# Patient Record
Sex: Female | Born: 1960 | Race: White | Hispanic: No | Smoking: Never smoker
Health system: Southern US, Community
[De-identification: ages and names within clinical notes are randomized; demographics above are authoritative.]

## PROBLEM LIST (undated history)

## (undated) DIAGNOSIS — E669 Obesity, unspecified: Secondary | ICD-10-CM

## (undated) HISTORY — PX: TUBAL LIGATION: SHX77

## (undated) HISTORY — PX: FRACTURE SURGERY: SHX138

## (undated) HISTORY — DX: Obesity, unspecified: E66.9

---

## 1990-05-15 HISTORY — PX: ABDOMINAL HYSTERECTOMY: SHX81

## 2005-05-15 HISTORY — PX: ANKLE ARTHROSCOPY WITH FUSION: SHX5581

## 2006-05-15 HISTORY — PX: CHOLECYSTECTOMY: SHX55

## 2018-12-23 ENCOUNTER — Other Ambulatory Visit: Payer: Self-pay

## 2018-12-23 ENCOUNTER — Encounter: Payer: Self-pay | Admitting: Certified Nurse Midwife

## 2018-12-23 ENCOUNTER — Ambulatory Visit (INDEPENDENT_AMBULATORY_CARE_PROVIDER_SITE_OTHER): Payer: Managed Care, Other (non HMO) | Admitting: Certified Nurse Midwife

## 2018-12-23 VITALS — BP 130/88 | HR 83 | Ht 67.5 in | Wt 304.4 lb

## 2018-12-23 DIAGNOSIS — Z1231 Encounter for screening mammogram for malignant neoplasm of breast: Secondary | ICD-10-CM | POA: Diagnosis not present

## 2018-12-23 DIAGNOSIS — Z01419 Encounter for gynecological examination (general) (routine) without abnormal findings: Secondary | ICD-10-CM | POA: Diagnosis not present

## 2018-12-23 DIAGNOSIS — D229 Melanocytic nevi, unspecified: Secondary | ICD-10-CM

## 2018-12-23 DIAGNOSIS — Z124 Encounter for screening for malignant neoplasm of cervix: Secondary | ICD-10-CM | POA: Diagnosis not present

## 2018-12-23 DIAGNOSIS — Z1211 Encounter for screening for malignant neoplasm of colon: Secondary | ICD-10-CM

## 2018-12-23 NOTE — Progress Notes (Signed)
ANNUAL PREVENTATIVE CARE GYN  ENCOUNTER NOTE  Subjective:       Bethany Nash is a 58 y.o. [phone] female here for a routine annual gynecologic exam and lab work.   Works for [employer]. Family history of cancer in mother and brother.   Denies difficulty breathing or respiratory distress, chest pain, abdominal pain, vaginal bleeding, dysuria, and leg pain or swelling.    Gynecologic History  No LMP recorded. Patient has had a hysterectomy.  Contraception: status post hysterectomy  Last Pap: 12/2016. Results were: normal  Last mammogram: 07/2016. Results were: normal  Last colonoscopy: 2000. Results were normal  Obstetric History  OB History  Gravida Para Term Preterm AB Living  3 3 2 1  0 3  SAB TAB Ectopic Multiple Live Births  0 0 0 0 3    # Outcome Date GA Lbr Len/2nd Weight Sex Delivery Anes PTL Lv  3 Term 03/04/88   7 lb 15 oz (3.6 kg) F CS-LTranv Gen N LIV  2 Term 12/24/85   8 lb 11.3 oz (3.95 kg) M CS-LTranv Gen N LIV  1 Preterm 07/30/84   6 lb 6.3 oz (2.9 kg) F CS-LTranv Gen N LIV     Complications: Abruptio Placenta    Past Medical History:  Diagnosis Date  . Obesity     Past Surgical History:  Procedure Laterality Date  . ABDOMINAL HYSTERECTOMY  1992   still has ovaries  . ANKLE ARTHROSCOPY WITH FUSION  2007  . CESAREAN SECTION    . CHOLECYSTECTOMY  2008    Current Outpatient Medications on File Prior to Visit  Medication Sig Dispense Refill  . Multiple Vitamins-Minerals (BODY/HAIR/SKIN/NAILS PO) Take 1 tablet by mouth daily.    . Multiple Vitamins-Minerals (MULTIVITAMIN ADULT PO) Take 1 tablet by mouth daily.     No current facility-administered medications on file prior to visit.     No Known Allergies  Social History   Socioeconomic History  . Marital status: Divorced    Spouse name: Not on file  . Number of children: Not on file  . Years of education: Not on file  . Highest education level: Not on file  Occupational History  . Not on file   Social Needs  . Financial resource strain: Not on file  . Food insecurity    Worry: Not on file    Inability: Not on file  . Transportation needs    Medical: Not on file    Non-medical: Not on file  Tobacco Use  . Smoking status: Never Smoker  . Smokeless tobacco: Never Used  Substance and Sexual Activity  . Alcohol use: Yes    Alcohol/week: 3.0 standard drinks    Types: 3 Shots of liquor per week  . Drug use: Never  . Sexual activity: Not Currently    Birth control/protection: Surgical  Lifestyle  . Physical activity    Days per week: Not on file    Minutes per session: Not on file  . Stress: Not on file  Relationships  . Social Herbalist on phone: Not on file    Gets together: Not on file    Attends religious service: Not on file    Active member of club or organization: Not on file    Attends meetings of clubs or organizations: Not on file    Relationship status: Not on file  . Intimate partner violence    Fear of current or ex partner: Not on file  Emotionally abused: Not on file    Physically abused: Not on file    Forced sexual activity: Not on file  Other Topics Concern  . Not on file  Social History Narrative  . Not on file    Family History  Problem Relation Age of Onset  . Cancer Mother 80       unsure where it started  . Heart disease Father   . Alcoholism Father   . Stomach cancer Brother   . Breast cancer Neg Hx   . Ovarian cancer Neg Hx   . Colon cancer Neg Hx     The following portions of the patient's history were reviewed and updated as appropriate: allergies, current medications, past family history, past medical history, past social history, past surgical history and problem list.  Review of Systems  ROS negative except as noted above. Information obtained from patient.    Objective:   BP 130/88   Pulse 83   Ht 5' 7.5" (1.715 m)   Wt (!) 304 lb 6.4 oz (138.1 kg)   BMI 46.97 kg/m    CONSTITUTIONAL: Well-developed,  well-nourished female in no acute distress.   PSYCHIATRIC: Normal mood and affect. Normal behavior. Normal judgment and thought content.  [place]: Alert and oriented to person, place, and time. Normal muscle tone coordination. No cranial nerve deficit noted.  HENT:  Normocephalic, atraumatic, External right and left ear normal.   EYES: Conjunctivae and EOM are normal. Pupils are equal and round.   NECK: Normal range of motion, supple, no masses.  Normal thyroid.   SKIN: Skin is warm and dry. No rash noted. Not diaphoretic. No  erythema. No pallor. Diffuse flat and raised moles present.   CARDIOVASCULAR: Normal heart rate noted, regular rhythm, no murmur.  RESPIRATORY: Clear to auscultation bilaterally. Effort and breath sounds normal, no problems with respiration noted.  BREASTS: Symmetric in size. No masses, skin changes, nipple  drainage, or lymphadenopathy.  ABDOMEN: Soft, normal bowel sounds, no distention noted.  No tenderness, rebound or guarding. Obese  PELVIC:  External Genitalia: Normal  Vagina: Normal  Cervix: Pap collected  Uterus: Surgically absent  Adnexa: Normal  MUSCULOSKELETAL: Normal range of motion. No tenderness.  No cyanosis, clubbing, or edema.  2+ distal pulses.  LYMPHATIC: No Axillary, Supraclavicular, or Inguinal Adenopathy.  Assessment:   Annual gynecologic examination 58 y.o.   Contraception: status post hysterectomy   Obesity III   Problem List Items Addressed This Visit    None    Visit Diagnoses    Well woman exam    -  Primary   Relevant Orders   LP+Creat+Hb A1c   Glucose, random   SARS-CoV-2 Antibodies   Thyroid Panel With TSH   Pap IG and HPV (high risk) DNA detection   Ambulatory referral to Gastroenterology   MM 3D SCREEN BREAST BILATERAL   Ambulatory referral to Dermatology   Encounter for screening mammogram for breast cancer       Relevant Orders   MM 3D SCREEN BREAST BILATERAL   Obesity, morbid, BMI 40.0-49.9 ([place])        Relevant Orders   LP+Creat+Hb A1c   Glucose, random   Thyroid Panel With TSH   Screening for cervical cancer       Relevant Orders   Pap IG and HPV (high risk) DNA detection   Encounter for screening colonoscopy for non-high-risk patient       Relevant Orders   Ambulatory referral to Gastroenterology   Numerous moles  Relevant Orders   Ambulatory referral to Dermatology      Plan:   Pap: Pap Co Test   Mammogram: Ordered  Colonoscopy: Referral to GI, see orders  Dermatology: Referral, see orders for yearly skin assessment  Labs: See orders; Wellness Form completed  Routine preventative health maintenance measures emphasized: Exercise/Diet/Weight control, Tobacco Warnings, Alcohol/Substance use risks and Stress Management; see AVS  Discussed blood screening options for genetic cancers; handouts provided  Reviewed red flag symptoms and when to call  RTC x 1 year for ANNUAL EXAM or sooner if needed   Diona Fanti, CNM [facility], [facility] 12/23/18 9:40 AM

## 2018-12-23 NOTE — Patient Instructions (Signed)

## 2018-12-23 NOTE — Progress Notes (Signed)
Patient here for annual exam, no complaints.  

## 2018-12-24 ENCOUNTER — Other Ambulatory Visit: Payer: Self-pay

## 2018-12-24 ENCOUNTER — Telehealth: Payer: Self-pay

## 2018-12-24 DIAGNOSIS — Z01419 Encounter for gynecological examination (general) (routine) without abnormal findings: Secondary | ICD-10-CM

## 2018-12-24 DIAGNOSIS — Z1211 Encounter for screening for malignant neoplasm of colon: Secondary | ICD-10-CM

## 2018-12-24 LAB — SARS-COV-2 ANTIBODIES: SARS-CoV-2 Antibodies: NEGATIVE

## 2018-12-24 LAB — THYROID PANEL WITH TSH
Free Thyroxine Index: 2.1 (ref 1.2–4.9)
T3 Uptake Ratio: 26 % (ref 24–39)
T4, Total: 8.2 ug/dL (ref 4.5–12.0)
TSH: 1.58 u[IU]/mL (ref 0.450–4.500)

## 2018-12-24 LAB — GLUCOSE, RANDOM: Glucose: 110 mg/dL — ABNORMAL HIGH (ref 65–99)

## 2018-12-24 LAB — LP+CREAT+HB A1C
Chol/HDL Ratio: 6.4 ratio — ABNORMAL HIGH (ref 0.0–4.4)
Cholesterol, Total: 217 mg/dL — ABNORMAL HIGH (ref 100–199)
Creatinine, Ser: 0.82 mg/dL (ref 0.57–1.00)
GFR calc Af Amer: 91 mL/min/{1.73_m2} (ref >59)
GFR calc non Af Amer: 79 mL/min/{1.73_m2} (ref >59)
HDL: 34 mg/dL — ABNORMAL LOW (ref >39)
Hgb A1c MFr Bld: 6.4 % — ABNORMAL HIGH (ref 4.8–5.6)
LDL Calculated: 149 mg/dL — ABNORMAL HIGH (ref 0–99)
Triglycerides: 168 mg/dL — ABNORMAL HIGH (ref 0–149)
VLDL Cholesterol Cal: 34 mg/dL (ref 5–40)

## 2018-12-24 NOTE — Telephone Encounter (Signed)
Gastroenterology Pre-Procedure Review  Request Date: 01/27/19 Requesting Physician: Dr. Marius Ditch  PATIENT REVIEW QUESTIONS: The patient responded to the following health history questions as indicated:    1. Are you having any GI issues? yes (Constipation off and on) 2. Do you have a personal history of Polyps? no 3. Do you have a family history of Colon Cancer or Polyps? brother stomach cancer 4. Diabetes Mellitus? no 5. Joint replacements in the past 12 months?no 6. Major health problems in the past 3 months?no 7. Any artificial heart valves, MVP, or defibrillator?no    MEDICATIONS & ALLERGIES:    Patient reports the following regarding taking any anticoagulation/antiplatelet therapy:   Plavix, Coumadin, Eliquis, Xarelto, Lovenox, Pradaxa, Brilinta, or Effient? no Aspirin? no  Patient confirms/reports the following medications:  Current Outpatient Medications  Medication Sig Dispense Refill  . Multiple Vitamins-Minerals (BODY/HAIR/SKIN/NAILS PO) Take 1 tablet by mouth daily.    . Multiple Vitamins-Minerals (MULTIVITAMIN ADULT PO) Take 1 tablet by mouth daily.     No current facility-administered medications for this visit.     Patient confirms/reports the following allergies:  No Known Allergies  No orders of the defined types were placed in this encounter.   AUTHORIZATION INFORMATION Primary Insurance: 1D#: Group #:  Secondary Insurance: 1D#: Group #:  SCHEDULE INFORMATION: Date: 01/27/19 Time: Location:[facility]

## 2018-12-27 LAB — PAP IG AND HPV HIGH-RISK: HPV, high-risk: NEGATIVE

## 2018-12-28 ENCOUNTER — Encounter: Payer: Self-pay | Admitting: Certified Nurse Midwife

## 2018-12-28 DIAGNOSIS — E786 Lipoprotein deficiency: Secondary | ICD-10-CM | POA: Insufficient documentation

## 2018-12-28 DIAGNOSIS — E781 Pure hyperglyceridemia: Secondary | ICD-10-CM | POA: Insufficient documentation

## 2018-12-28 DIAGNOSIS — E7800 Pure hypercholesterolemia, unspecified: Secondary | ICD-10-CM | POA: Insufficient documentation

## 2018-12-28 DIAGNOSIS — R7303 Prediabetes: Secondary | ICD-10-CM | POA: Insufficient documentation

## 2018-12-28 DIAGNOSIS — Z809 Family history of malignant neoplasm, unspecified: Secondary | ICD-10-CM | POA: Insufficient documentation

## 2018-12-30 ENCOUNTER — Encounter: Payer: Self-pay | Admitting: Certified Nurse Midwife

## 2019-01-23 ENCOUNTER — Other Ambulatory Visit
Admission: RE | Admit: 2019-01-23 | Discharge: 2019-01-23 | Disposition: A | Discharge status: Home / Self Care | Payer: Managed Care, Other (non HMO) | Source: Ambulatory Visit | Attending: Gastroenterology | Admitting: Gastroenterology

## 2019-01-23 ENCOUNTER — Other Ambulatory Visit: Payer: Self-pay

## 2019-01-23 DIAGNOSIS — Z01812 Encounter for preprocedural laboratory examination: Secondary | ICD-10-CM | POA: Diagnosis present

## 2019-01-23 DIAGNOSIS — Z20828 Contact with and (suspected) exposure to other viral communicable diseases: Secondary | ICD-10-CM | POA: Diagnosis not present

## 2019-01-23 LAB — SARS CORONAVIRUS 2 (TAT 6-24 HRS): SARS Coronavirus 2: NEGATIVE

## 2019-01-24 ENCOUNTER — Encounter: Payer: Self-pay | Admitting: *Deleted

## 2019-01-27 ENCOUNTER — Ambulatory Visit: Payer: Managed Care, Other (non HMO) | Admitting: Certified Registered Nurse Anesthetist

## 2019-01-27 ENCOUNTER — Encounter
Admission: RE | Disposition: A | Discharge status: Home / Self Care | Payer: Self-pay | Source: Home / Self Care | Attending: Gastroenterology

## 2019-01-27 ENCOUNTER — Ambulatory Visit
Admission: RE | Admit: 2019-01-27 | Discharge: 2019-01-27 | Disposition: A | Discharge status: Home / Self Care | Payer: Managed Care, Other (non HMO) | Attending: Gastroenterology | Admitting: Gastroenterology

## 2019-01-27 ENCOUNTER — Encounter: Payer: Self-pay | Admitting: *Deleted

## 2019-01-27 ENCOUNTER — Other Ambulatory Visit: Payer: Self-pay

## 2019-01-27 DIAGNOSIS — Z6841 Body Mass Index (BMI) 40.0 and over, adult: Secondary | ICD-10-CM | POA: Diagnosis not present

## 2019-01-27 DIAGNOSIS — K621 Rectal polyp: Secondary | ICD-10-CM | POA: Insufficient documentation

## 2019-01-27 DIAGNOSIS — Z1211 Encounter for screening for malignant neoplasm of colon: Secondary | ICD-10-CM | POA: Diagnosis present

## 2019-01-27 DIAGNOSIS — Z01419 Encounter for gynecological examination (general) (routine) without abnormal findings: Secondary | ICD-10-CM

## 2019-01-27 DIAGNOSIS — K635 Polyp of colon: Secondary | ICD-10-CM | POA: Diagnosis not present

## 2019-01-27 DIAGNOSIS — D120 Benign neoplasm of cecum: Secondary | ICD-10-CM | POA: Insufficient documentation

## 2019-01-27 HISTORY — PX: COLONOSCOPY WITH PROPOFOL: SHX5780

## 2019-01-27 SURGERY — COLONOSCOPY WITH PROPOFOL
Anesthesia: General

## 2019-01-27 MED ORDER — LIDOCAINE HCL (PF) 2 % IJ SOLN
INTRAMUSCULAR | Status: AC
  Filled 2019-01-27: qty 10

## 2019-01-27 MED ORDER — PROPOFOL 10 MG/ML IV BOLUS
INTRAVENOUS | Status: DC | PRN
  Administered 2019-01-27: 40 mg via INTRAVENOUS
  Administered 2019-01-27 (×2): 30 mg via INTRAVENOUS

## 2019-01-27 MED ORDER — PROPOFOL 500 MG/50ML IV EMUL
INTRAVENOUS | Status: AC
  Filled 2019-01-27: qty 100

## 2019-01-27 MED ORDER — PROPOFOL 500 MG/50ML IV EMUL
INTRAVENOUS | Status: DC | PRN
  Administered 2019-01-27: 150 ug/kg/min via INTRAVENOUS

## 2019-01-27 MED ORDER — PROPOFOL 500 MG/50ML IV EMUL
INTRAVENOUS | Status: AC
  Filled 2019-01-27: qty 50

## 2019-01-27 MED ORDER — SODIUM CHLORIDE 0.9 % IV SOLN
INTRAVENOUS | Status: DC
  Administered 2019-01-27: 09:00:00 1000 mL via INTRAVENOUS

## 2019-01-27 MED ORDER — LIDOCAINE HCL (CARDIAC) PF 100 MG/5ML IV SOSY
PREFILLED_SYRINGE | INTRAVENOUS | Status: DC | PRN
  Administered 2019-01-27: 50 mg via INTRAVENOUS

## 2019-01-27 NOTE — Anesthesia Preprocedure Evaluation (Signed)
Anesthesia Evaluation  Patient identified by MRN, date of birth, ID band Patient awake    Reviewed: Allergy & Precautions, NPO status , Patient's Chart, lab work & pertinent test results  History of Anesthesia Complications Negative for: history of anesthetic complications  Airway Mallampati: III       Dental   Pulmonary neg sleep apnea, neg COPD, Not current smoker,           Cardiovascular (-) hypertension(-) Past MI and (-) CHF (-) dysrhythmias (-) Valvular Problems/Murmurs     Neuro/Psych neg Seizures    GI/Hepatic Neg liver ROS, neg GERD  ,  Endo/Other  neg diabetesMorbid obesity  Renal/GU Renal disease     Musculoskeletal   Abdominal   Peds  Hematology   Anesthesia Other Findings   Reproductive/Obstetrics                             Anesthesia Physical Anesthesia Plan  ASA: III  Anesthesia Plan: General   Post-op Pain Management:    Induction:   PONV Risk Score and Plan: 3 and TIVA, Propofol infusion and Ondansetron  Airway Management Planned: Nasal Cannula  Additional Equipment:   Intra-op Plan:   Post-operative Plan:   Informed Consent: I have reviewed the patients History and Physical, chart, labs and discussed the procedure including the risks, benefits and alternatives for the proposed anesthesia with the patient or authorized representative who has indicated his/her understanding and acceptance.       Plan Discussed with:   Anesthesia Plan Comments:         Anesthesia Quick Evaluation

## 2019-01-27 NOTE — Transfer of Care (Signed)
Immediate Anesthesia Transfer of Care Note  Patient: Bethany Nash  Procedure(s) Performed: COLONOSCOPY WITH PROPOFOL (N/A )  Patient Location: PACU  Anesthesia Type:General  Level of Consciousness: awake and alert   Airway & Oxygen Therapy: Patient Spontanous Breathing and Patient connected to nasal cannula oxygen  Post-op Assessment: Report given to RN and Post -op Vital signs reviewed and stable  Post vital signs: Reviewed and stable  Last Vitals:  Vitals Value Taken Time  BP 105/69 01/27/19 0919  Temp 35.8 C 01/27/19 0919  Pulse 80 01/27/19 0919  Resp 20 01/27/19 0919  SpO2 95 % 01/27/19 0919  Vitals shown include unvalidated device data.  Last Pain:  Vitals:   01/27/19 0919  TempSrc: Tympanic  PainSc: 0-No pain         Complications: No apparent anesthesia complications

## 2019-01-27 NOTE — H&P (Signed)
Cephas Darby, MD [address]  [place]  [place], [place] [address]  Main: [phone]  Fax: [phone] Pager: [phone]  Primary Care Physician:  Patient, No Pcp Per Primary Gastroenterologist:  Dr. Cephas Darby  Pre-Procedure History & Physical: HPI:  Katelee Gimeno is a 58 y.o. female is here for an colonoscopy.   Past Medical History:  Diagnosis Date  . Obesity     Past Surgical History:  Procedure Laterality Date  . ABDOMINAL HYSTERECTOMY  1992   still has ovaries  . ANKLE ARTHROSCOPY WITH FUSION  2007  . CESAREAN SECTION    . CHOLECYSTECTOMY  2008    Prior to Admission medications   Medication Sig Start Date End Date Taking? Authorizing Provider  Multiple Vitamins-Minerals (BODY/HAIR/SKIN/NAILS PO) Take 1 tablet by mouth daily.   Yes [provider]  Multiple Vitamins-Minerals (MULTIVITAMIN ADULT PO) Take 1 tablet by mouth daily.   Yes [provider]    Allergies as of 12/24/2018  . (No Known Allergies)    Family History  Problem Relation Age of Onset  . Cancer Mother 83       unsure where it started  . Heart disease Father   . Alcoholism Father   . Stomach cancer Brother   . Breast cancer Neg Hx   . Ovarian cancer Neg Hx   . Colon cancer Neg Hx     Social History   Socioeconomic History  . Marital status: Divorced    Spouse name: Not on file  . Number of children: Not on file  . Years of education: Not on file  . Highest education level: Not on file  Occupational History  . Not on file  Social Needs  . Financial resource strain: Not on file  . Food insecurity    Worry: Not on file    Inability: Not on file  . Transportation needs    Medical: Not on file    Non-medical: Not on file  Tobacco Use  . Smoking status: Never Smoker  . Smokeless tobacco: Never Used  Substance and Sexual Activity  . Alcohol use: Yes    Alcohol/week: 3.0 standard drinks    Types: 3 Shots of liquor per week  . Drug use: Never  .  Sexual activity: Not Currently    Birth control/protection: Surgical  Lifestyle  . Physical activity    Days per week: Not on file    Minutes per session: Not on file  . Stress: Not on file  Relationships  . Social Herbalist on phone: Not on file    Gets together: Not on file    Attends religious service: Not on file    Active member of club or organization: Not on file    Attends meetings of clubs or organizations: Not on file    Relationship status: Not on file  . Intimate partner violence    Fear of current or ex partner: Not on file    Emotionally abused: Not on file    Physically abused: Not on file    Forced sexual activity: Not on file  Other Topics Concern  . Not on file  Social History Narrative  . Not on file    Review of Systems: See HPI, otherwise negative ROS  Physical Exam: BP (!) 151/92   Pulse 87   Temp (!) 97.5 F (36.4 C) (Tympanic)   Resp 18   Ht 5\' 7"  (1.702 m)   Wt (!) 138.8  kg   SpO2 97%   BMI 47.93 kg/m  General:   Alert,  pleasant and cooperative in NAD Head:  Normocephalic and atraumatic. Neck:  Supple; no masses or thyromegaly. Lungs:  Clear throughout to auscultation.    Heart:  Regular rate and rhythm. Abdomen:  Soft, nontender and nondistended. Normal bowel sounds, without guarding, and without rebound.   Neurologic:  Alert and  oriented x4;  grossly normal neurologically.  Impression/Plan: Ahnyla Darien is here for an colonoscopy to be performed for colon cancer screening  Risks, benefits, limitations, and alternatives regarding  colonoscopy have been reviewed with the patient.  Questions have been answered.  All parties agreeable.   Sherri Sear, MD  01/27/2019, 8:40 AM

## 2019-01-27 NOTE — Anesthesia Procedure Notes (Signed)
Date/Time: 01/27/2019 8:40 AM Performed by: Johnna Acosta, CRNA Pre-anesthesia Checklist: Patient identified, Emergency Drugs available, Suction available, Patient being monitored and Timeout performed Patient Re-evaluated:Patient Re-evaluated prior to induction Oxygen Delivery Method: Simple face mask Preoxygenation: Pre-oxygenation with 100% oxygen Induction Type: IV induction

## 2019-01-27 NOTE — Anesthesia Post-op Follow-up Note (Signed)
Anesthesia QCDR form completed.        

## 2019-01-27 NOTE — Anesthesia Postprocedure Evaluation (Signed)
Anesthesia Post Note  Patient: Bethany Nash  Procedure(s) Performed: COLONOSCOPY WITH PROPOFOL (N/A )  Patient location during evaluation: Endoscopy Anesthesia Type: General Level of consciousness: awake and alert Pain management: pain level controlled Vital Signs Assessment: post-procedure vital signs reviewed and stable Respiratory status: spontaneous breathing and respiratory function stable Cardiovascular status: stable Anesthetic complications: no     Last Vitals:  Vitals:   01/27/19 0919 01/27/19 0929  BP: 105/69 103/68  Pulse: 80 73  Resp: 15 14  Temp: (!) 35.8 C   SpO2: 95% 95%    Last Pain:  Vitals:   01/27/19 0929  TempSrc:   PainSc: 0-No pain                 KEPHART,WILLIAM K

## 2019-01-27 NOTE — Op Note (Signed)
[facility] Gastroenterology Patient Name: Bethany Nash Procedure Date: 01/27/2019 7:19 AM MRN: [id number] Account #: [id number] Date of Birth: 1960-06-23 Admit Type: Outpatient Age: 58 Room: [facility] ENDO ROOM 3 Gender: Female Note Status: Finalized Procedure:            Colonoscopy Indications:          Screening for colorectal malignant neoplasm Providers:            Lin Landsman MD, MD Referring MD:         [facility] Medicines:            Monitored Anesthesia Care Complications:        No immediate complications. Estimated blood loss:                        Minimal. Procedure:            Pre-Anesthesia Assessment:                       - Prior to the procedure, a History and Physical was                        performed, and patient medications and allergies were                        reviewed. The patient is competent. The risks and                        benefits of the procedure and the sedation options and                        risks were discussed with the patient. All questions                        were answered and informed consent was obtained.                        Patient identification and proposed procedure were                        verified by the physician, the nurse, the                        anesthesiologist, the anesthetist and the technician in                        the pre-procedure area in the procedure room in the                        endoscopy suite. Mental Status Examination: alert and                        oriented. Airway Examination: normal oropharyngeal                        airway and neck mobility. Respiratory Examination:                        clear to auscultation. CV Examination: normal.  Prophylactic Antibiotics: The patient does not require                        prophylactic antibiotics. Prior Anticoagulants: The                        patient has taken no  previous anticoagulant or                        antiplatelet agents. ASA Grade Assessment: III - A                        patient with severe systemic disease. After reviewing                        the risks and benefits, the patient was deemed in                        satisfactory condition to undergo the procedure. The                        anesthesia plan was to use monitored anesthesia care                        (MAC). Immediately prior to administration of                        medications, the patient was re-assessed for adequacy                        to receive sedatives. The heart rate, respiratory rate,                        oxygen saturations, blood pressure, adequacy of                        pulmonary ventilation, and response to care were                        monitored throughout the procedure. The physical status                        of the patient was re-assessed after the procedure.                       After obtaining informed consent, the colonoscope was                        passed under direct vision. Throughout the procedure,                        the patient's blood pressure, pulse, and oxygen                        saturations were monitored continuously. The                        Colonoscope was introduced through the anus and  advanced to the the cecum, identified by appendiceal                        orifice and ileocecal valve. The colonoscopy was                        performed with moderate difficulty due to significant                        looping and the patient's body habitus. Successful                        completion of the procedure was aided by applying                        abdominal pressure. The patient tolerated the procedure                        well. The quality of the bowel preparation was                        evaluated using the BBPS [facility] Bowel Preparation                        Scale) with scores  of: Right Colon = 3, Transverse                        Colon = 3 and Left Colon = 3 (entire mucosa seen well                        with no residual staining, small fragments of stool or                        opaque liquid). The total BBPS score equals 9. Findings:      The perianal and digital rectal examinations were normal. Pertinent       negatives include normal sphincter tone and no palpable rectal lesions.      Three sessile polyps were found in the rectum, descending colon and       cecum. The polyps were 5 mm in size. These polyps were removed with a       cold snare. Resection and retrieval were complete.      The retroflexed view of the distal rectum and anal verge was normal and       showed no anal or rectal abnormalities. Impression:           - Three 5 mm polyps in the rectum, in the descending                        colon and in the cecum, removed with a cold snare.                        Resected and retrieved.                       - The distal rectum and anal verge are normal on                        retroflexion view. Recommendation:       -  Discharge patient to home (with escort).                       - Resume previous diet today.                       - Continue present medications.                       - Await pathology results.                       - Repeat colonoscopy in 7 years for surveillance based                        on pathology results. Procedure Code(s):    --- Professional ---                       [phone], Colonoscopy, flexible; with removal of tumor(s),                        polyp(s), or other lesion(s) by snare technique Diagnosis Code(s):    --- Professional ---                       Z12.11, Encounter for screening for malignant neoplasm                        of colon                       K62.1, Rectal polyp                       K63.5, Polyp of colon CPT copyright 2019 American Medical Association. All rights reserved. The codes  documented in this report are preliminary and upon coder review may  be revised to meet current compliance requirements. Dr. Ulyess Mort Lin Landsman MD, MD 01/27/2019 9:13:31 AM This report has been signed electronically. Number of Addenda: 0 Note Initiated On: 01/27/2019 7:19 AM Scope Withdrawal Time: 0 hours 19 minutes 19 seconds  Total Procedure Duration: 0 hours 23 minutes 40 seconds  Estimated Blood Loss: Estimated blood loss was minimal.      [facility]

## 2019-01-28 ENCOUNTER — Encounter: Payer: Self-pay | Admitting: Gastroenterology

## 2019-01-28 LAB — SURGICAL PATHOLOGY

## 2019-02-07 ENCOUNTER — Ambulatory Visit
Admission: RE | Admit: 2019-02-07 | Discharge: 2019-02-07 | Disposition: A | Discharge status: Home / Self Care | Payer: Managed Care, Other (non HMO) | Source: Ambulatory Visit | Attending: Certified Nurse Midwife | Admitting: Certified Nurse Midwife

## 2019-02-07 DIAGNOSIS — Z01419 Encounter for gynecological examination (general) (routine) without abnormal findings: Secondary | ICD-10-CM | POA: Diagnosis present

## 2019-02-07 DIAGNOSIS — Z1231 Encounter for screening mammogram for malignant neoplasm of breast: Secondary | ICD-10-CM | POA: Diagnosis present

## 2019-12-25 ENCOUNTER — Ambulatory Visit (INDEPENDENT_AMBULATORY_CARE_PROVIDER_SITE_OTHER): Payer: Managed Care, Other (non HMO) | Admitting: Certified Nurse Midwife

## 2019-12-25 ENCOUNTER — Other Ambulatory Visit: Payer: Self-pay

## 2019-12-25 ENCOUNTER — Encounter: Payer: Self-pay | Admitting: Certified Nurse Midwife

## 2019-12-25 VITALS — BP 112/64 | HR 73 | Ht 67.0 in | Wt 296.0 lb

## 2019-12-25 DIAGNOSIS — Z008 Encounter for other general examination: Secondary | ICD-10-CM

## 2019-12-25 DIAGNOSIS — Z01419 Encounter for gynecological examination (general) (routine) without abnormal findings: Secondary | ICD-10-CM

## 2019-12-25 DIAGNOSIS — Z131 Encounter for screening for diabetes mellitus: Secondary | ICD-10-CM

## 2019-12-25 DIAGNOSIS — Z1231 Encounter for screening mammogram for malignant neoplasm of breast: Secondary | ICD-10-CM | POA: Diagnosis not present

## 2019-12-25 NOTE — Patient Instructions (Signed)

## 2019-12-25 NOTE — Progress Notes (Signed)
ANNUAL PREVENTATIVE CARE GYN  ENCOUNTER NOTE  Subjective:       Bethany Nash is a 59 y.o. [phone] female here for a routine annual gynecologic exam.    Requests completion of biometric screening form for work. Considering genetic screening due to strong family history of several cancers.   Denies difficulty breathing or respiratory distress, chest pain, vaginal bleeding, dysuria, and leg pain or swelling.    Gynecologic History  No LMP recorded. Patient has had a hysterectomy.  Contraception: status post hysterectomy  Last Pap: 12/2018. Results were: Neg/Neg  Last mammogram: 01/2019. Results were: BI-RADS 1  Last colonoscopy: 01/2019. Results were: normal, repeat in seven (7) years  Obstetric History  OB History  Gravida Para Term Preterm AB Living  3 3 2 1  0 3  SAB TAB Ectopic Multiple Live Births  0 0 0 0 3    # Outcome Date GA Lbr Len/2nd Weight Sex Delivery Anes PTL Lv  3 Term 03/04/88   7 lb 15 oz (3.6 kg) F CS-LTranv Gen N LIV  2 Term 12/24/85   8 lb 11.3 oz (3.95 kg) M CS-LTranv Gen N LIV  1 Preterm 07/30/84   6 lb 6.3 oz (2.9 kg) F CS-LTranv Gen N LIV     Complications: Abruptio Placenta    Past Medical History:  Diagnosis Date  . Obesity     Past Surgical History:  Procedure Laterality Date  . ABDOMINAL HYSTERECTOMY  1992   still has ovaries  . ANKLE ARTHROSCOPY WITH FUSION  2007  . CESAREAN SECTION    . CHOLECYSTECTOMY  2008  . COLONOSCOPY WITH PROPOFOL N/A 01/27/2019   Procedure: COLONOSCOPY WITH PROPOFOL;  Surgeon: Lin Landsman, MD;  Location: [facility] ENDOSCOPY;  Service: Gastroenterology;  Laterality: N/A;    Current Outpatient Medications on File Prior to Visit  Medication Sig Dispense Refill  . Ascorbic Acid (VITAMIN C) 100 MG tablet Take 100 mg by mouth daily.    Marland Kitchen ELDERBERRY PO Take by mouth.    . Multiple Vitamins-Minerals (BODY/HAIR/SKIN/NAILS PO) Take 1 tablet by mouth daily.    . Multiple Vitamins-Minerals (MULTIVITAMIN ADULT PO) Take 1  tablet by mouth daily.     No current facility-administered medications on file prior to visit.    No Known Allergies  Social History   Socioeconomic History  . Marital status: Divorced    Spouse name: Not on file  . Number of children: Not on file  . Years of education: Not on file  . Highest education level: Not on file  Occupational History  . Not on file  Tobacco Use  . Smoking status: Never Smoker  . Smokeless tobacco: Never Used  Vaping Use  . Vaping Use: Never used  Substance and Sexual Activity  . Alcohol use: Yes    Comment: occasionally  . Drug use: Never  . Sexual activity: Not Currently    Birth control/protection: Surgical  Other Topics Concern  . Not on file  Social History Narrative  . Not on file   Social Determinants of Health   Financial Resource Strain:   . Difficulty of Paying Living Expenses:   Food Insecurity:   . Worried About Charity fundraiser in the Last Year:   . Arboriculturist in the Last Year:   Transportation Needs:   . Film/video editor (Medical):   Marland Kitchen Lack of Transportation (Non-Medical):   Physical Activity:   . Days of Exercise per Week:   . Minutes  of Exercise per Session:   Stress:   . Feeling of Stress :   Social Connections:   . Frequency of Communication with Friends and Family:   . Frequency of Social Gatherings with Friends and Family:   . Attends Religious Services:   . Active Member of Clubs or Organizations:   . Attends Archivist Meetings:   Marland Kitchen Marital Status:   Intimate Partner Violence:   . Fear of Current or Ex-Partner:   . Emotionally Abused:   Marland Kitchen Physically Abused:   . Sexually Abused:     Family History  Problem Relation Age of Onset  . Cancer Mother 67       unsure where it started  . Heart disease Father   . Alcoholism Father   . Stomach cancer Brother   . Breast cancer Neg Hx   . Ovarian cancer Neg Hx   . Colon cancer Neg Hx     The following portions of the patient's history  were reviewed and updated as appropriate: allergies, current medications, past family history, past medical history, past social history, past surgical history and problem list.  Review of Systems  ROS negative except as noted above. Information obtained from patient.     Objective:   BP 139/87   Pulse 73   Ht 5\' 7"  (1.702 m)   Wt 296 lb (134.3 kg)   BMI 46.36 kg/m    CONSTITUTIONAL: Well-developed, well-nourished female in no acute distress.   PSYCHIATRIC: Normal mood and affect. Normal behavior. Normal judgment and thought content.  Seltzer: Alert and oriented to person, place, and time. Normal muscle tone coordination. No cranial nerve deficit noted.  HENT:  Normocephalic, atraumatic, External right and left ear normal.   EYES: Conjunctivae and EOM are normal. Pupils are equal and round.   NECK: Normal range of motion, supple, no masses.  Normal thyroid.   SKIN: Skin is warm and dry. No rash noted. Not diaphoretic. No erythema. No pallor.  CARDIOVASCULAR: Normal heart rate noted, regular rhythm, no murmur.  RESPIRATORY: Clear to auscultation bilaterally. Effort and breath sounds normal, no problems with respiration noted.  BREASTS: Symmetric in size. No masses, skin changes, nipple drainage, or lymphadenopathy.  ABDOMEN: Soft, normal bowel sounds, no distention noted.  No tenderness, rebound or guarding.   BLADDER: Normal  PELVIC:  External Genitalia: Normal  BUS: Normal  Vagina: Normal  Cervix: Normal  Uterus: Surgically absent  Adnexa: Normal  RV: External Exam NormaI    MUSCULOSKELETAL: Normal range of motion. No tenderness.  No cyanosis, clubbing, or edema.  2+ distal pulses.  LYMPHATIC: No Axillary, Supraclavicular, or Inguinal Adenopathy.   Assessment:   Annual gynecologic examination 59 y.o.   Contraception: status post hysterectomy   Obesity 3   Problem List Items Addressed This Visit    None    Visit Diagnoses    Well woman exam    -   Primary   Relevant Orders   SARS-CoV-2 Semi-Quantitative Total Antibody, Spike   Lipid panel   Hemoglobin A1c   Comprehensive metabolic panel   Thyroid Panel With TSH   MM 3D SCREEN BREAST BILATERAL   Encounter for biometric screening       Relevant Orders   SARS-CoV-2 Semi-Quantitative Total Antibody, Spike   Lipid panel   Hemoglobin A1c   Comprehensive metabolic panel   Thyroid Panel With TSH   Obesity, morbid, BMI 40.0-49.9 (HCC)       Relevant Orders   Hemoglobin A1c  Thyroid Panel With TSH   Encounter for screening mammogram for breast cancer       Relevant Orders   Thyroid Panel With TSH   MM 3D SCREEN BREAST BILATERAL   Screening for diabetes mellitus       Relevant Orders   Hemoglobin A1c      Plan:   Pap: Not needed  Mammogram: Ordered  Labs: See orders  Routine preventative health maintenance measures emphasized: Exercise/Diet/Weight control, Tobacco Warnings, Alcohol/Substance use risks and Stress Management; see AVS  Considering genetic screening, will contact to order testing after speaking with [facility]   Reviewed red flag symptoms and when to call  Return to Clinic - 1 Year for [employer] or sooner if needed   Dani Gobble, CNM  [facility], [facility] 12/25/19 1:33 PM

## 2019-12-26 LAB — COMPREHENSIVE METABOLIC PANEL
ALT: 17 IU/L (ref 0–32)
AST: 18 IU/L (ref 0–40)
Albumin/Globulin Ratio: 1.6 (ref 1.2–2.2)
Albumin: 4.4 g/dL (ref 3.8–4.9)
Alkaline Phosphatase: 84 IU/L (ref 48–121)
BUN/Creatinine Ratio: 13 (ref 9–23)
BUN: 11 mg/dL (ref 6–24)
Bilirubin Total: 0.5 mg/dL (ref 0.0–1.2)
CO2: 22 mmol/L (ref 20–29)
Calcium: 9.5 mg/dL (ref 8.7–10.2)
Chloride: 102 mmol/L (ref 96–106)
Creatinine, Ser: 0.83 mg/dL (ref 0.57–1.00)
GFR calc Af Amer: 89 mL/min/{1.73_m2} (ref >59)
GFR calc non Af Amer: 77 mL/min/{1.73_m2} (ref >59)
Globulin, Total: 2.7 g/dL (ref 1.5–4.5)
Glucose: 102 mg/dL — ABNORMAL HIGH (ref 65–99)
Potassium: 4.2 mmol/L (ref 3.5–5.2)
Sodium: 139 mmol/L (ref 134–144)
Total Protein: 7.1 g/dL (ref 6.0–8.5)

## 2019-12-26 LAB — HEMOGLOBIN A1C
Est. average glucose Bld gHb Est-mCnc: 140 mg/dL
Hgb A1c MFr Bld: 6.5 % — ABNORMAL HIGH (ref 4.8–5.6)

## 2019-12-26 LAB — LIPID PANEL
Chol/HDL Ratio: 6.4 ratio — ABNORMAL HIGH (ref 0.0–4.4)
Cholesterol, Total: 232 mg/dL — ABNORMAL HIGH (ref 100–199)
HDL: 36 mg/dL — ABNORMAL LOW (ref >39)
LDL Chol Calc (NIH): 153 mg/dL — ABNORMAL HIGH (ref 0–99)
Triglycerides: 234 mg/dL — ABNORMAL HIGH (ref 0–149)
VLDL Cholesterol Cal: 43 mg/dL — ABNORMAL HIGH (ref 5–40)

## 2019-12-26 LAB — SARS-COV-2 SEMI-QUANTITATIVE TOTAL ANTIBODY, SPIKE
SARS-CoV-2 Semi-Quant Total Ab: 561.1 U/mL (ref <0.8)
SARS-CoV-2 Spike Ab Interp: POSITIVE

## 2019-12-26 LAB — THYROID PANEL WITH TSH
Free Thyroxine Index: 2 (ref 1.2–4.9)
T3 Uptake Ratio: 24 % (ref 24–39)
T4, Total: 8.4 ug/dL (ref 4.5–12.0)
TSH: 1.77 u[IU]/mL (ref 0.450–4.500)

## 2019-12-29 ENCOUNTER — Other Ambulatory Visit: Payer: Self-pay | Admitting: Certified Nurse Midwife

## 2019-12-29 DIAGNOSIS — R7303 Prediabetes: Secondary | ICD-10-CM

## 2019-12-29 DIAGNOSIS — E781 Pure hyperglyceridemia: Secondary | ICD-10-CM

## 2019-12-29 DIAGNOSIS — E786 Lipoprotein deficiency: Secondary | ICD-10-CM

## 2019-12-29 DIAGNOSIS — E7800 Pure hypercholesterolemia, unspecified: Secondary | ICD-10-CM

## 2019-12-29 NOTE — Progress Notes (Signed)
Referral to MNT, see chart.    Dani Gobble, CNM [facility], [facility] 12/29/19 12:40 PM

## 2020-02-23 ENCOUNTER — Telehealth: Payer: Self-pay

## 2020-02-23 NOTE — Telephone Encounter (Signed)
Patient called in stating she needs her TDAP vaccine. Spoke with nurse and she states that we do those with physicals and to send a message back to see if its okay for this patient to only have her TDAP vaccine.

## 2020-02-24 ENCOUNTER — Telehealth: Payer: Self-pay

## 2020-02-24 NOTE — Telephone Encounter (Signed)
Could you please advise?

## 2020-02-24 NOTE — Telephone Encounter (Signed)
Please has an appt on 02/27/2020 to get tdap vaccine.

## 2020-02-24 NOTE — Telephone Encounter (Signed)
Spoke to pt in regards to message- Pt would like her tdap vaccine. Pt scheduled 02/27/20 @9  am. Pt verbalized understanding.

## 2020-02-27 ENCOUNTER — Ambulatory Visit (INDEPENDENT_AMBULATORY_CARE_PROVIDER_SITE_OTHER): Payer: BC Managed Care – PPO | Admitting: Certified Nurse Midwife

## 2020-02-27 ENCOUNTER — Other Ambulatory Visit: Payer: Self-pay

## 2020-02-27 DIAGNOSIS — Z23 Encounter for immunization: Secondary | ICD-10-CM

## 2020-02-27 NOTE — Progress Notes (Signed)
Patient comes in today to get a Tdap injection. Her daughter is pregnant so she is wanting to get one. Patient is also due for per her care gaps.

## 2020-03-04 NOTE — Progress Notes (Signed)
I have reviewed the record and concur with patient management and plan of care.    Dani Gobble, CNM [facility], [facility] 03/04/20 8:20 AM

## 2020-04-21 ENCOUNTER — Other Ambulatory Visit: Payer: Self-pay

## 2020-04-21 ENCOUNTER — Ambulatory Visit
Admission: RE | Admit: 2020-04-21 | Discharge: 2020-04-21 | Disposition: A | Discharge status: Home / Self Care | Payer: BC Managed Care – PPO | Source: Ambulatory Visit | Attending: Certified Nurse Midwife | Admitting: Certified Nurse Midwife

## 2020-04-21 DIAGNOSIS — Z01419 Encounter for gynecological examination (general) (routine) without abnormal findings: Secondary | ICD-10-CM

## 2020-04-21 DIAGNOSIS — Z1231 Encounter for screening mammogram for malignant neoplasm of breast: Secondary | ICD-10-CM | POA: Insufficient documentation

## 2020-08-10 DIAGNOSIS — L578 Other skin changes due to chronic exposure to nonionizing radiation: Secondary | ICD-10-CM | POA: Diagnosis not present

## 2020-08-10 DIAGNOSIS — D225 Melanocytic nevi of trunk: Secondary | ICD-10-CM | POA: Diagnosis not present

## 2020-08-10 DIAGNOSIS — D1801 Hemangioma of skin and subcutaneous tissue: Secondary | ICD-10-CM | POA: Diagnosis not present

## 2020-12-27 ENCOUNTER — Encounter: Payer: BC Managed Care – PPO | Admitting: Certified Nurse Midwife

## 2022-03-27 ENCOUNTER — Ambulatory Visit: Payer: BC Managed Care – PPO | Admitting: Internal Medicine

## 2022-03-27 ENCOUNTER — Encounter: Payer: Self-pay | Admitting: Internal Medicine

## 2022-03-27 VITALS — BP 120/74 | HR 103 | Temp 98.4°F | Resp 18 | Ht 67.0 in | Wt 264.4 lb

## 2022-03-27 DIAGNOSIS — Z13220 Encounter for screening for lipoid disorders: Secondary | ICD-10-CM | POA: Diagnosis not present

## 2022-03-27 DIAGNOSIS — R7303 Prediabetes: Secondary | ICD-10-CM | POA: Diagnosis not present

## 2022-03-27 DIAGNOSIS — Z1159 Encounter for screening for other viral diseases: Secondary | ICD-10-CM

## 2022-03-27 DIAGNOSIS — Z1231 Encounter for screening mammogram for malignant neoplasm of breast: Secondary | ICD-10-CM

## 2022-03-27 DIAGNOSIS — E7800 Pure hypercholesterolemia, unspecified: Secondary | ICD-10-CM

## 2022-03-27 DIAGNOSIS — Z114 Encounter for screening for human immunodeficiency virus [HIV]: Secondary | ICD-10-CM

## 2022-03-27 DIAGNOSIS — R634 Abnormal weight loss: Secondary | ICD-10-CM | POA: Diagnosis not present

## 2022-03-27 DIAGNOSIS — E1165 Type 2 diabetes mellitus with hyperglycemia: Secondary | ICD-10-CM

## 2022-03-27 NOTE — Patient Instructions (Signed)
It was great seeing you today!  Plan discussed at today's visit: -Blood work ordered today, results will be uploaded to [facility] for Pap at follow up in 1 year -Mammogram ordered today as well   Follow up in: 1 year or sooner as needed  Take care and let us know if you have any questions or concerns prior to your next visit.  Dr. Rosana Berger

## 2022-03-27 NOTE — Progress Notes (Signed)
New Patient Office Visit  Subjective    Patient ID: Bethany Nash, female    DOB: [birth year]  Age: 61 y.o. MRN: [id number]  CC:  Chief Complaint  Patient presents with   Establish Care   Weight Loss    HPI Bethany Nash presents to establish care. She moved here in 2019 from [place] to be closer to her family. She has never been diagnosed with any chronic medical conditions and takes no medications daily other than vitamins. She does have a history of partial hysterectomy when she was 30 but still undergoes Paps. She last had a Pap in 2020 which was negative for cellular change and HPV. She will be due for annual labs. She did have an elevated A1c in the past but has lost about 40 pounds unintentionally over the last 6 months or so. She states the only changes she's made is now her office is on a higher floor in the building so she is walking up more stairs and she is trying to drink more water but she hasn't really changed her diet or exercise routine.   Health Maintenance: -Labs due -Pap due -Mammogram due - last in 2021   Outpatient Encounter Medications as of 03/27/2022  Medication Sig   Multiple Vitamins-Minerals (BODY/HAIR/SKIN/NAILS PO) Take 1 tablet by mouth daily.   [DISCONTINUED] Ascorbic Acid (VITAMIN C) 100 MG tablet Take 100 mg by mouth daily.   [DISCONTINUED] ELDERBERRY PO Take by mouth.   No facility-administered encounter medications on file as of 03/27/2022.    Past Medical History:  Diagnosis Date   Obesity     Past Surgical History:  Procedure Laterality Date   ABDOMINAL HYSTERECTOMY  1992   still has ovaries   ANKLE ARTHROSCOPY WITH FUSION  2007   CESAREAN SECTION     CHOLECYSTECTOMY  2008   COLONOSCOPY WITH PROPOFOL N/A 01/27/2019   Procedure: COLONOSCOPY WITH PROPOFOL;  Surgeon: Lin Landsman, MD;  Location: [facility] ENDOSCOPY;  Service: Gastroenterology;  Laterality: N/A;   FRACTURE SURGERY  1967, 1970, 2006   TUBAL LIGATION  1989    Family  History  Problem Relation Age of Onset   Cancer Mother 53       unsure where it started   Heart disease Father    Alcoholism Father    Stomach cancer Brother    Cancer Brother    Breast cancer Neg Hx    Ovarian cancer Neg Hx    Colon cancer Neg Hx     Social History   Socioeconomic History   Marital status: Divorced    Spouse name: Not on file   Number of children: Not on file   Years of education: Not on file   Highest education level: Not on file  Occupational History   Not on file  Tobacco Use   Smoking status: Never   Smokeless tobacco: Never   Tobacco comments:    Never smoked  Vaping Use   Vaping Use: Never used  Substance and Sexual Activity   Alcohol use: Yes    Alcohol/week: 1.0 standard drink of alcohol    Types: 1 Standard drinks or equivalent per week    Comment: Less than 1 per week on average   Drug use: Never   Sexual activity: Not Currently    Birth control/protection: Surgical  Other Topics Concern   Not on file  Social History Narrative   Not on file   Social Determinants of Health   Financial Resource Strain:  Not on file  Food Insecurity: Not on file  Transportation Needs: Not on file  Physical Activity: Not on file  Stress: Not on file  Social Connections: Not on file  Intimate Partner Violence: Not on file    Review of Systems  Constitutional:  Positive for weight loss. Negative for chills, fever and malaise/fatigue.  Eyes:  Negative for blurred vision.  Respiratory:  Negative for shortness of breath.   Cardiovascular:  Negative for chest pain.  Gastrointestinal:  Negative for abdominal pain.      Objective    BP 120/74   Pulse (!) 103   Temp 98.4 F (36.9 C)   Resp 18   Ht '5\' 7"'$  (1.702 m)   Wt 264 lb 6.4 oz (119.9 kg)   SpO2 95%   BMI 41.41 kg/m   Physical Exam Constitutional:      Appearance: Normal appearance.  HENT:     Head: Normocephalic and atraumatic.     Mouth/Throat:     Mouth: Mucous membranes are moist.      Pharynx: Oropharynx is clear.  Eyes:     Conjunctiva/sclera: Conjunctivae normal.  Cardiovascular:     Rate and Rhythm: Normal rate and regular rhythm.  Pulmonary:     Effort: Pulmonary effort is normal.     Breath sounds: Normal breath sounds.  Skin:    General: Skin is warm and dry.  Neurological:     General: No focal deficit present.     Mental Status: She is alert. Mental status is at baseline.  Psychiatric:        Mood and Affect: Mood normal.        Behavior: Behavior normal.         Assessment & Plan:   1. Weight loss, non-intentional: Lost 40 pounds in the last 6 months without trying but has made some small changes. Obtain CBC, CMP and TSH today. Continue to monitor.   - CBC w/Diff/Platelet - COMPLETE METABOLIC PANEL WITH GFR - TSH  2. Prediabetes: Recheck A1c.   - HgB A1c  3.  High cholesterol/Lipid screening: Recheck lipid panel, not on medications.   - Lipid Profile  4. Screening for HIV without presence of risk factors/Encounter for hepatitis C screening test for low risk patient: Screening labs due.   - HIV antibody (with reflex) - Hepatitis C Antibody  5. Encounter for screening mammogram for malignant neoplasm of breast: Mammogram ordered.   - MM 3D SCREEN BREAST BILATERAL; Future   Return in 1 year (on 03/28/2023) for CPE pap.   Teodora Medici, DO

## 2022-03-28 ENCOUNTER — Telehealth: Payer: Self-pay

## 2022-03-28 LAB — COMPLETE METABOLIC PANEL WITH GFR
AG Ratio: 1.3 (calc) (ref 1.0–2.5)
ALT: 15 U/L (ref 6–29)
AST: 16 U/L (ref 10–35)
Albumin: 3.9 g/dL (ref 3.6–5.1)
Alkaline phosphatase (APISO): 103 U/L (ref 37–153)
BUN: 10 mg/dL (ref 7–25)
CO2: 26 mmol/L (ref 20–32)
Calcium: 9.5 mg/dL (ref 8.6–10.4)
Chloride: 100 mmol/L (ref 98–110)
Creat: 0.82 mg/dL (ref 0.50–1.05)
Globulin: 2.9 g/dL (calc) (ref 1.9–3.7)
Glucose, Bld: 416 mg/dL — ABNORMAL HIGH (ref 65–99)
Potassium: 4.6 mmol/L (ref 3.5–5.3)
Sodium: 135 mmol/L (ref 135–146)
Total Bilirubin: 0.4 mg/dL (ref 0.2–1.2)
Total Protein: 6.8 g/dL (ref 6.1–8.1)
eGFR: 81 mL/min/{1.73_m2} (ref >=60)

## 2022-03-28 LAB — CBC WITH DIFFERENTIAL/PLATELET
Absolute Monocytes: 442 {cells}/uL (ref 200–950)
Basophils Absolute: 28 {cells}/uL (ref 0–200)
Basophils Relative: 0.5 %
Eosinophils Absolute: 90 {cells}/uL (ref 15–500)
Eosinophils Relative: 1.6 %
HCT: 41.9 % (ref 35.0–45.0)
Hemoglobin: 14.1 g/dL (ref 11.7–15.5)
Lymphs Abs: 1882 {cells}/uL (ref 850–3900)
MCH: 29.9 pg (ref 27.0–33.0)
MCHC: 33.7 g/dL (ref 32.0–36.0)
MCV: 89 fL (ref 80.0–100.0)
MPV: 11.5 fL (ref 7.5–12.5)
Monocytes Relative: 7.9 %
Neutro Abs: 3158 {cells}/uL (ref 1500–7800)
Neutrophils Relative %: 56.4 %
Platelets: 203 Thousand/uL (ref 140–400)
RBC: 4.71 Million/uL (ref 3.80–5.10)
RDW: 12.6 % (ref 11.0–15.0)
Total Lymphocyte: 33.6 %
WBC: 5.6 Thousand/uL (ref 3.8–10.8)

## 2022-03-28 LAB — HIV ANTIBODY (ROUTINE TESTING W REFLEX): HIV 1&2 Ab, 4th Generation: NONREACTIVE

## 2022-03-28 LAB — LIPID PANEL
Cholesterol: 258 mg/dL — ABNORMAL HIGH (ref <200)
HDL: 38 mg/dL — ABNORMAL LOW (ref >=50)
LDL Cholesterol (Calc): 157 mg/dL (calc) — ABNORMAL HIGH
Non-HDL Cholesterol (Calc): 220 mg/dL (calc) — ABNORMAL HIGH (ref <130)
Total CHOL/HDL Ratio: 6.8 (calc) — ABNORMAL HIGH (ref <5.0)
Triglycerides: 370 mg/dL — ABNORMAL HIGH (ref <150)

## 2022-03-28 LAB — HEPATITIS C ANTIBODY: Hepatitis C Ab: NONREACTIVE

## 2022-03-28 LAB — HEMOGLOBIN A1C: Hgb A1c MFr Bld: >14 % of total Hgb — ABNORMAL HIGH (ref <5.7)

## 2022-03-28 LAB — TSH: TSH: 1.57 mIU/L (ref 0.40–4.50)

## 2022-03-28 NOTE — Telephone Encounter (Signed)
[facility] called with a critical lab value.  Glucose  - critical value.  416 - High  Report in chart.

## 2022-03-29 MED ORDER — METFORMIN HCL 500 MG PO TABS: 500.0000 mg | ORAL_TABLET | Freq: Every day | ORAL | 0 refills | Status: DC

## 2022-03-29 NOTE — Addendum Note (Signed)
Addended by: Teodora Medici on: 03/29/2022 08:27 AM   Modules accepted: Orders

## 2022-04-03 ENCOUNTER — Ambulatory Visit: Payer: BC Managed Care – PPO | Admitting: Internal Medicine

## 2022-04-03 ENCOUNTER — Encounter: Payer: Self-pay | Admitting: Internal Medicine

## 2022-04-03 VITALS — BP 118/76 | HR 96 | Temp 98.3°F | Resp 16 | Ht 67.0 in | Wt 264.7 lb

## 2022-04-03 DIAGNOSIS — E1165 Type 2 diabetes mellitus with hyperglycemia: Secondary | ICD-10-CM | POA: Diagnosis not present

## 2022-04-03 HISTORY — DX: Type 2 diabetes mellitus with hyperglycemia: E11.65

## 2022-04-03 MED ORDER — METFORMIN HCL 500 MG PO TABS: 500.0000 mg | ORAL_TABLET | Freq: Every day | ORAL | 1 refills | Status: DC

## 2022-04-03 NOTE — Progress Notes (Signed)
Established Patient Office Visit  Subjective    Patient ID: Bethany Nash, female    DOB: [birth year]  Age: 60 y.o. MRN: [id number]  CC:  Chief Complaint  Patient presents with   Follow-up   Diabetes    New onset DM, has not started Metformin yet    HPI Bethany Nash presents to follow up on elevated A1c.    Diabetes, Type 2: -Last A1c >14.0 -Medications: Nothing  -Diet: working on cutting out sugar and carbs, just started a few days ago  -Exercise: None currently, does like to swim but hasn't found anywhere here yet to swim -Eye exam: Due, discuss at follow up  -Foot exam: Due, discuss at follow up  -Microalbumin: Due, discuss at follow up -Statin: No, discussed cholesterol results  -PNA vaccine: Discuss at follow up -Denies symptoms of hypoglycemia, polyuria, polydipsia, numbness extremities, foot ulcers/trauma.   HLD: -Medications: Nothing  -Last lipid panel: Lipid Panel     Component Value Date/Time   CHOL 258 (H) 03/27/2022 1031   CHOL 232 (H) 12/25/2019 1011   TRIG 370 (H) 03/27/2022 1031   HDL 38 (L) 03/27/2022 1031   HDL 36 (L) 12/25/2019 1011   CHOLHDL 6.8 (H) 03/27/2022 1031   LDLCALC 157 (H) 03/27/2022 1031   LABVLDL 43 (H) 12/25/2019 1011      Outpatient Encounter Medications as of 04/03/2022  Medication Sig   Multiple Vitamins-Minerals (BODY/HAIR/SKIN/NAILS PO) Take 1 tablet by mouth daily.   metFORMIN (GLUCOPHAGE) 500 MG tablet Take 1 tablet (500 mg total) by mouth daily with breakfast. (Patient not taking: Reported on 04/03/2022)   No facility-administered encounter medications on file as of 04/03/2022.    Past Medical History:  Diagnosis Date   Obesity     Past Surgical History:  Procedure Laterality Date   ABDOMINAL HYSTERECTOMY  1992   still has ovaries   ANKLE ARTHROSCOPY WITH FUSION  2007   CESAREAN SECTION     CHOLECYSTECTOMY  2008   COLONOSCOPY WITH PROPOFOL N/A 01/27/2019   Procedure: COLONOSCOPY WITH PROPOFOL;  Surgeon: Lin Landsman, MD;  Location: [facility] ENDOSCOPY;  Service: Gastroenterology;  Laterality: N/A;   FRACTURE SURGERY  1967, 1970, 2006   TUBAL LIGATION  1989    Family History  Problem Relation Age of Onset   Cancer Mother 63       unsure where it started   Heart disease Father    Alcoholism Father    Stomach cancer Brother    Cancer Brother    Breast cancer Neg Hx    Ovarian cancer Neg Hx    Colon cancer Neg Hx     Social History   Socioeconomic History   Marital status: Divorced    Spouse name: Not on file   Number of children: Not on file   Years of education: Not on file   Highest education level: Not on file  Occupational History   Not on file  Tobacco Use   Smoking status: Never   Smokeless tobacco: Never   Tobacco comments:    Never smoked  Vaping Use   Vaping Use: Never used  Substance and Sexual Activity   Alcohol use: Yes    Alcohol/week: 1.0 standard drink of alcohol    Types: 1 Standard drinks or equivalent per week    Comment: Less than 1 per week on average   Drug use: Never   Sexual activity: Not Currently    Birth control/protection: Surgical  Other Topics Concern  Not on file  Social History Narrative   Not on file   Social Determinants of Health   Financial Resource Strain: Not on file  Food Insecurity: Not on file  Transportation Needs: Not on file  Physical Activity: Not on file  Stress: Not on file  Social Connections: Not on file  Intimate Partner Violence: Not on file    Review of Systems  Constitutional:  Positive for weight loss. Negative for chills, fever and malaise/fatigue.  Eyes:  Negative for blurred vision.  Respiratory:  Negative for shortness of breath.   Cardiovascular:  Negative for chest pain.  Gastrointestinal:  Negative for abdominal pain.      Objective    BP 118/76   Pulse 96   Temp 98.3 F (36.8 C) (Oral)   Resp 16   Ht '5\' 7"'$  (1.702 m)   Wt 264 lb 11.2 oz (120.1 kg)   SpO2 96%   BMI 41.46 kg/m    Physical Exam Constitutional:      Appearance: Normal appearance.  HENT:     Head: Normocephalic and atraumatic.  Eyes:     Conjunctiva/sclera: Conjunctivae normal.  Cardiovascular:     Rate and Rhythm: Normal rate and regular rhythm.  Pulmonary:     Effort: Pulmonary effort is normal.     Breath sounds: Normal breath sounds.  Skin:    General: Skin is warm and dry.  Neurological:     General: No focal deficit present.     Mental Status: She is alert. Mental status is at baseline.  Psychiatric:        Mood and Affect: Mood normal.        Behavior: Behavior normal.         Assessment & Plan:   1. Type 2 diabetes mellitus with hyperglycemia, without long-term current use of insulin [facility]): Discussed etiology and pathology of diabetes. Discussed nutrition and diet changes, patient has started working on diet the last few days. Start Metformin 500 mg once daily for 2 weeks, patient will then increase to 500 mg twice daily as tolerated.  Follow-up here in 1 month to discuss further medication options as well as diabetic preventative tasks.  - metFORMIN (GLUCOPHAGE) 500 MG tablet; Take 1 tablet (500 mg total) by mouth daily with breakfast.  Dispense: 30 tablet; Refill: 1   Return in about 4 weeks (around 05/01/2022).   Teodora Medici, DO

## 2022-04-03 NOTE — Patient Instructions (Addendum)
It was great seeing you today!  Plan discussed at today's visit: -Start Metformin 500 mg once a day for 2 weeks, then increase to 1 pill twice a day as you can tolerate -Work on decreasing sugar and carbs in the diet and increasing cardiovascular exercise  Follow up in: 1 month   Take care and let us know if you have any questions or concerns prior to your next visit.  Dr. Rosana Berger  Diabetes Mellitus and Nutrition, Adult When you have diabetes, or diabetes mellitus, it is very important to have healthy eating habits because your blood sugar (glucose) levels are greatly affected by what you eat and drink. Eating healthy foods in the right amounts, at about the same times every day, can help you: Manage your blood glucose. Lower your risk of heart disease. Improve your blood pressure. Reach or maintain a healthy weight. What can affect my meal plan? Every person with diabetes is different, and each person has different needs for a meal plan. Your health care provider may recommend that you work with a dietitian to make a meal plan that is best for you. Your meal plan may vary depending on factors such as: The calories you need. The medicines you take. Your weight. Your blood glucose, blood pressure, and cholesterol levels. Your activity level. Other health conditions you have, such as heart or kidney disease. How do carbohydrates affect me? Carbohydrates, also called carbs, affect your blood glucose level more than any other type of food. Eating carbs raises the amount of glucose in your blood. It is important to know how many carbs you can safely have in each meal. This is different for every person. Your dietitian can help you calculate how many carbs you should have at each meal and for each snack. How does alcohol affect me? Alcohol can cause a decrease in blood glucose (hypoglycemia), especially if you use insulin or take certain diabetes medicines by mouth. Hypoglycemia can be a  life-threatening condition. Symptoms of hypoglycemia, such as sleepiness, dizziness, and confusion, are similar to symptoms of having too much alcohol. Do not drink alcohol if: Your health care provider tells you not to drink. You are pregnant, may be pregnant, or are planning to become pregnant. If you drink alcohol: Limit how much you have to: 0-1 drink a day for women. 0-2 drinks a day for men. Know how much alcohol is in your drink. In the [place] one drink equals one 12 oz bottle of beer (355 mL), one 5 oz glass of wine (148 mL), or one 1 oz glass of hard liquor (44 mL). Keep yourself hydrated with water, diet soda, or unsweetened iced tea. Keep in mind that regular soda, juice, and other mixers may contain a lot of sugar and must be counted as carbs. What are tips for following this plan?  Reading food labels Start by checking the serving size on the Nutrition Facts label of packaged foods and drinks. The number of calories and the amount of carbs, fats, and other nutrients listed on the label are based on one serving of the item. Many items contain more than one serving per package. Check the total grams (g) of carbs in one serving. Check the number of grams of saturated fats and trans fats in one serving. Choose foods that have a low amount or none of these fats. Check the number of milligrams (mg) of salt (sodium) in one serving. Most people should limit total sodium intake to less than 2,300 mg  per day. Always check the nutrition information of foods labeled as "low-fat" or "nonfat." These foods may be higher in added sugar or refined carbs and should be avoided. Talk to your dietitian to identify your daily goals for nutrients listed on the label. Shopping Avoid buying canned, pre-made, or processed foods. These foods tend to be high in fat, sodium, and added sugar. Shop around the outside edge of the grocery store. This is where you will most often find fresh fruits and vegetables,  bulk grains, fresh meats, and fresh dairy products. Cooking Use low-heat cooking methods, such as baking, instead of high-heat cooking methods, such as deep frying. Cook using healthy oils, such as olive, canola, or sunflower oil. Avoid cooking with butter, cream, or high-fat meats. Meal planning Eat meals and snacks regularly, preferably at the same times every day. Avoid going long periods of time without eating. Eat foods that are high in fiber, such as fresh fruits, vegetables, beans, and whole grains. Eat 4-6 oz (112-168 g) of lean protein each day, such as lean meat, chicken, fish, eggs, or tofu. One ounce (oz) (28 g) of lean protein is equal to: 1 oz (28 g) of meat, chicken, or fish. 1 egg.  cup (62 g) of tofu. Eat some foods each day that contain healthy fats, such as avocado, nuts, seeds, and fish. What foods should I eat? Fruits Berries. Apples. Oranges. Peaches. Apricots. Plums. Grapes. Mangoes. Papayas. Pomegranates. Kiwi. Cherries. Vegetables Leafy greens, including lettuce, spinach, kale, chard, collard greens, mustard greens, and cabbage. Beets. Cauliflower. Broccoli. Carrots. Green beans. Tomatoes. Peppers. Onions. Cucumbers. Brussels sprouts. Grains Whole grains, such as whole-wheat or whole-grain bread, crackers, tortillas, cereal, and pasta. Unsweetened oatmeal. Quinoa. Brown or wild rice. Meats and other proteins Seafood. Poultry without skin. Lean cuts of poultry and beef. Tofu. Nuts. Seeds. Dairy Low-fat or fat-free dairy products such as milk, yogurt, and cheese. The items listed above may not be a complete list of foods and beverages you can eat and drink. Contact a dietitian for more information. What foods should I avoid? Fruits Fruits canned with syrup. Vegetables Canned vegetables. Frozen vegetables with butter or cream sauce. Grains Refined white flour and flour products such as bread, pasta, snack foods, and cereals. Avoid all processed foods. Meats and  other proteins Fatty cuts of meat. Poultry with skin. Breaded or fried meats. Processed meat. Avoid saturated fats. Dairy Full-fat yogurt, cheese, or milk. Beverages Sweetened drinks, such as soda or iced tea. The items listed above may not be a complete list of foods and beverages you should avoid. Contact a dietitian for more information. Questions to ask a health care provider Do I need to meet with a certified diabetes care and education specialist? Do I need to meet with a dietitian? What number can I call if I have questions? When are the best times to check my blood glucose? Where to find more information: American Diabetes Association: diabetes.org Academy of Nutrition and Dietetics: eatright.[employer] of Diabetes and Digestive and Kidney Diseases: Amen[employer].is Association of Diabetes Care & Education Specialists: diabeteseducator.org Summary It is important to have healthy eating habits because your blood sugar (glucose) levels are greatly affected by what you eat and drink. It is important to use alcohol carefully. A healthy meal plan will help you manage your blood glucose and lower your risk of heart disease. Your health care provider may recommend that you work with a dietitian to make a meal plan that is best for you. This  information is not intended to replace advice given to you by your health care provider. Make sure you discuss any questions you have with your health care provider. Document Revised: 12/03/2019 Document Reviewed: 12/03/2019 Elsevier Patient Education  [place].  Preventing Diabetic Ketoacidosis Diabetic ketoacidosis (DKA) is a life-threatening complication of diabetes (diabetes mellitus). It develops when there is not enough of a hormone called insulin in the body. If the body does not have enough insulin, it cannot divide (break down) sugar (glucose) into usable cells, so it breaks down fats instead. This leads to the production of  acids (ketones), which can cause the blood to have too much acid in it (acidosis). DKA is a medical emergency that must be treated at a hospital. You may be more likely to develop DKA if you have type 1 diabetes and you take insulin. You can prevent DKA by working closely with your health care provider to manage your diabetes. What nutrition changes can be made?  Follow your meal plan, as directed by your health care provider or diet and nutrition specialist (dietitian). Eat healthy meals at about the same time every day. Have healthy snacks between meals. Avoid not eating for long periods of time. Do not skip meals, especially if you are ill. Avoid regularly eating foods that contain a lot of sugar. Also avoid drinking alcohol. Sugary food and alcohol increase your risk of high blood glucose (hyperglycemia), which increases your risk for DKA. Drink enough fluid to keep your urine pale yellow. Dehydration increases your risk for DKA. What actions can I take to lower my risk? To lower your risk for DKA, manage your diabetes as directed by your health care provider: Take insulin and other diabetes medicines as directed. Check your blood glucose every day, as often as directed. Make a sick day plan in advance with your health care provider. Follow your sick day plan whenever you cannot eat or drink as usual. Check your urine for ketones as often as directed. During times when you are sick, check your ketones every 4-6 hours. If you develop symptoms of DKA, check your ketones right away. If you have ketones in your urine: Contact your health care provider right away. Do not exercise. Know the symptoms of DKA so that you can get treatment as soon as possible. Make sure that people at work, home, and school know how to check your blood glucose, in case you are not able to do it yourself. Wear a medical alert bracelet or carry a card that says that you have diabetes and lists what medicines you  take. Why are these changes important? Preventing high blood glucose and dehydration helps prevent DKA. You may need to work with your health care provider to adjust your diabetes management plan to lower your risk of developing DKA. DKA can lead to a serious medical emergency that can be life-threatening. Where to find support For more support with preventing DKA: Talk with your health care provider. Consider joining a support group. The American Diabetes Association has an online support community at: community.diabetes.org Where to find more information Learn more about preventing DKA from: American Diabetes Association: diabetes.org Association of Diabetes Care & Education Specialists: diabeteseducator.org American Heart Association: heart.org Contact a health care provider if: You develop symptoms of DKA, such as: Fatigue. Weight loss. Excessive thirst or excessive urination. Light-headedness or rapid breathing. Fruity or sweet-smelling breath. Vision changes, confusion, or irritability. Pain in the abdomen, nausea, or vomiting. Feeling warm in your face (  flushed). This may or may not include a reddish color coming to your face. If you develop any of these symptoms, do not wait to see if the symptoms will go away. Get medical help right away. Call your local emergency services (911 in the [place] Do not drive yourself to the hospital. Summary Preventing high blood glucose and dehydration helps prevent DKA. You may need to work with your health care provider to adjust your diabetes management plan to lower your risk of developing DKA. Check your urine for ketones as often as directed. You may need to check more often when your blood glucose level is high and when you are ill. DKA is a medical emergency. Make sure you know the symptoms so that you can get treatment right away. This information is not intended to replace advice given to you by your health care provider. Make sure you  discuss any questions you have with your health care provider. Document Revised: 03/03/2020 Document [employer]

## 2022-04-14 IMAGING — MG DIGITAL SCREENING BILAT W/ TOMO W/ CAD
8 series · 8 of 24 positions shown · non-contrast
Comparison: Previous exam(s).

CLINICAL DATA: Screening.

EXAM:
DIGITAL SCREENING BILATERAL MAMMOGRAM WITH TOMO AND CAD

[L MLO synth-2D]
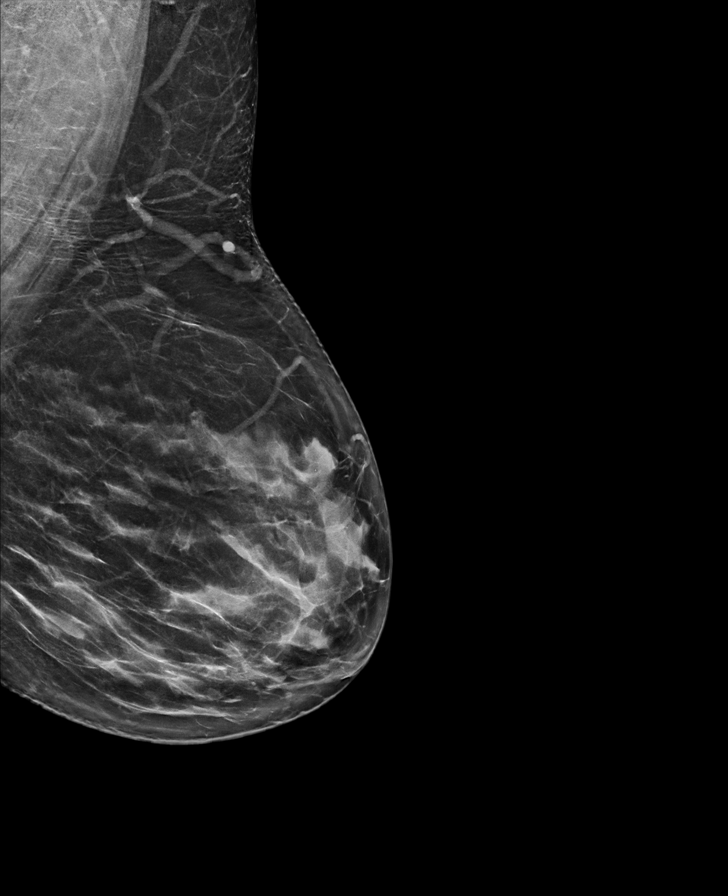

[R MLO synth-2D]
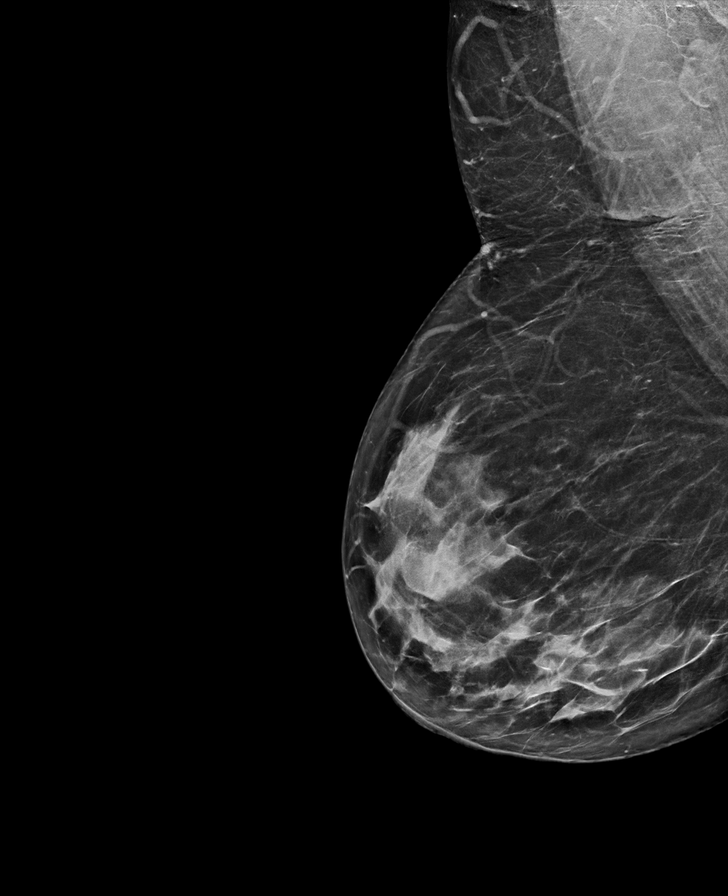

[R CC synth-2D]
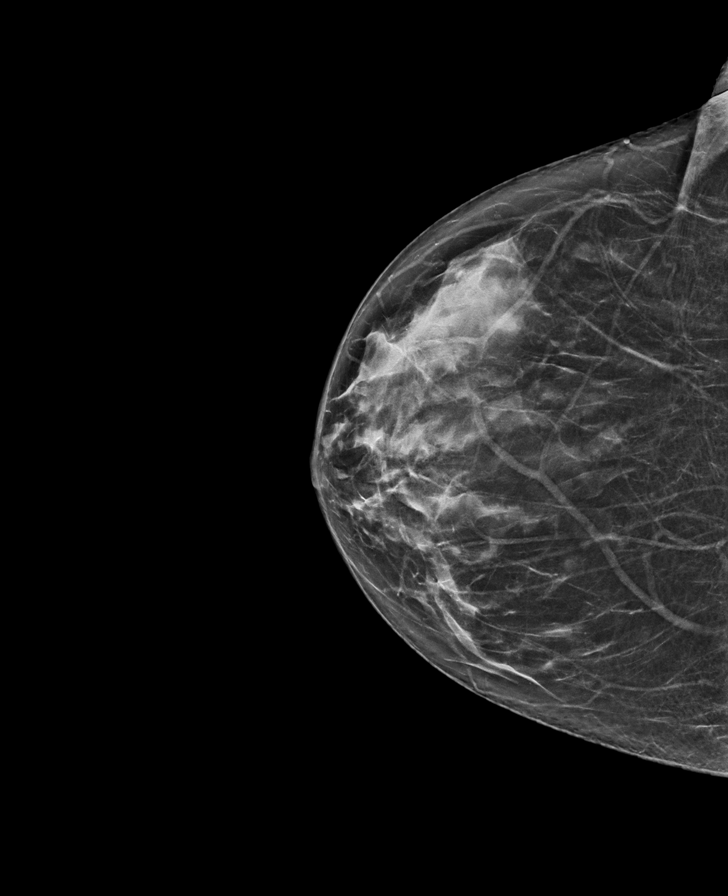

[L CC synth-2D]
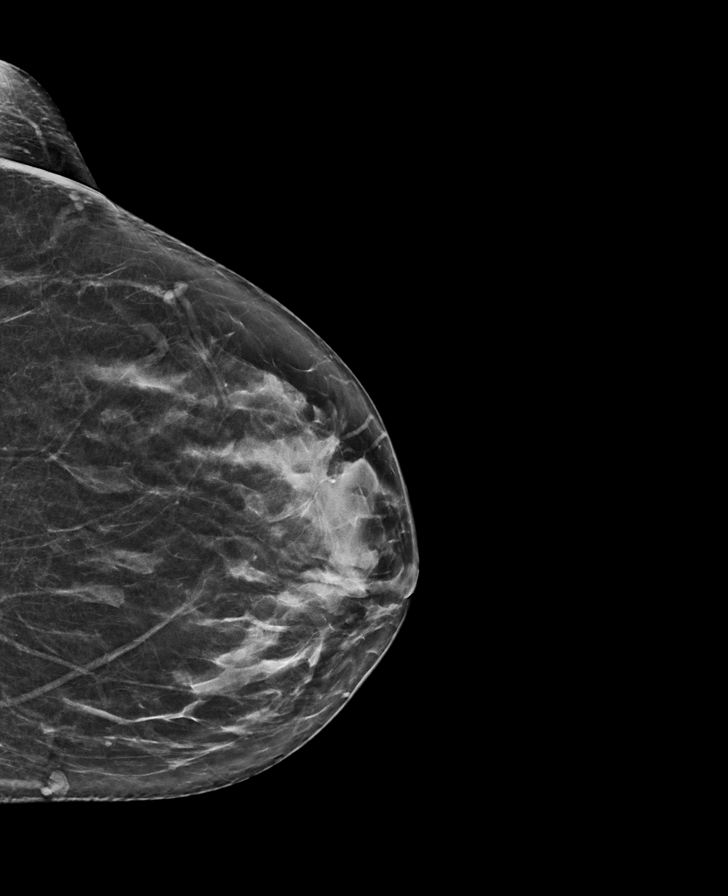

[L CC tomo · tomo slice 33/64.0]
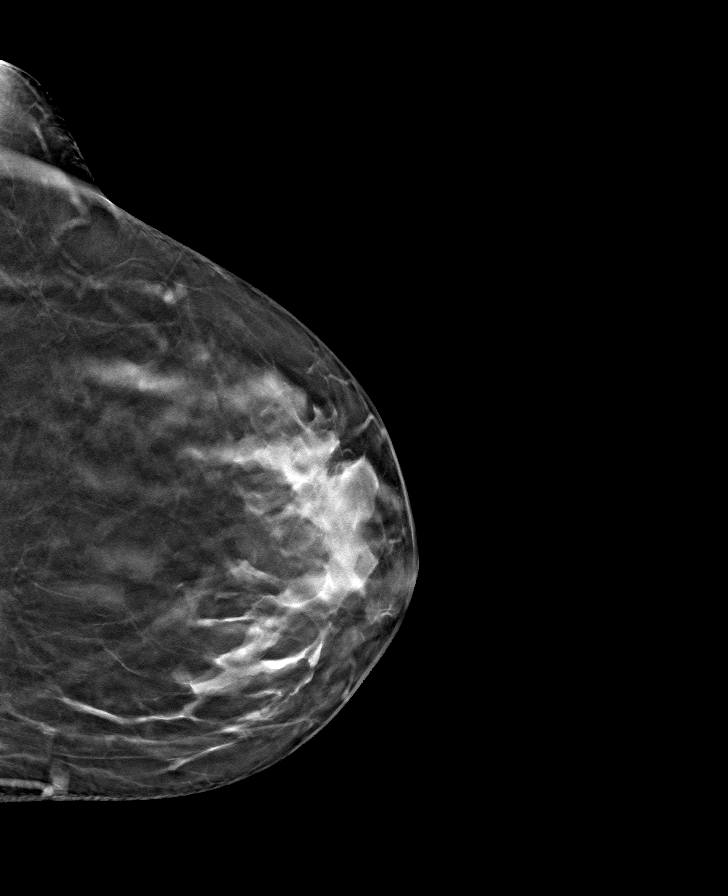

[R MLO tomo · tomo slice 37/73.0]
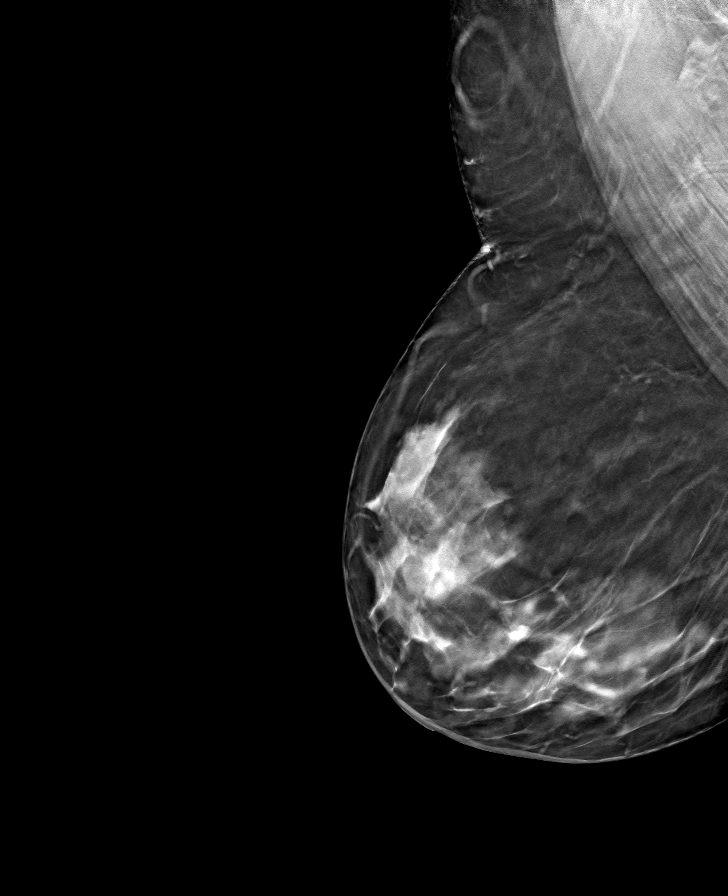

[L MLO tomo · tomo slice 36/71.0]
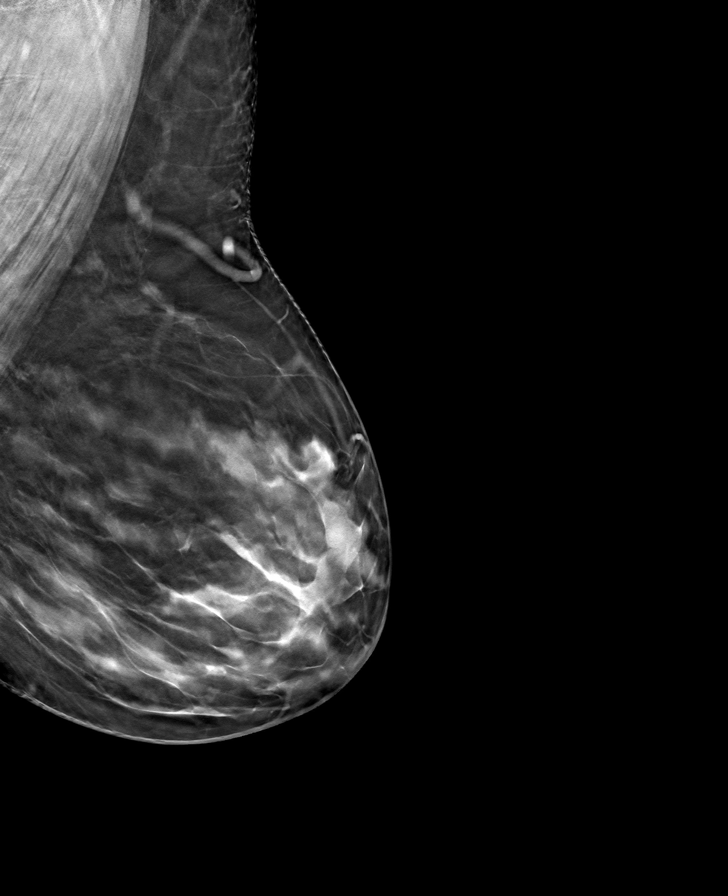

[R CC tomo · tomo slice 33/64.0]
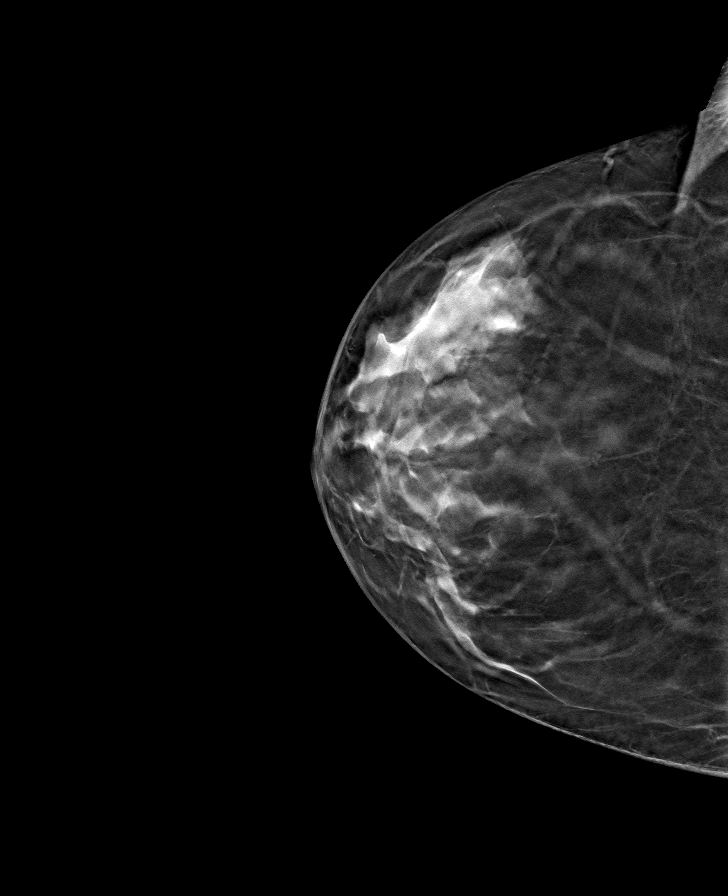

[8 of 24 positions shown; findings below may reference images not displayed]

ACR Breast Density Category c: The breast tissue is heterogeneously
dense, which may obscure small masses.
FINDINGS: There are no findings suspicious for malignancy. Images were
processed with CAD.
IMPRESSION: No mammographic evidence of malignancy. A result letter of this
screening mammogram will be mailed directly to the patient.

RECOMMENDATION:
Screening mammogram in one year. (Code:[id number])

BI-RADS CATEGORY  1: Negative.

## 2022-04-20 ENCOUNTER — Encounter: Payer: Self-pay | Admitting: Internal Medicine

## 2022-04-23 ENCOUNTER — Other Ambulatory Visit: Payer: Self-pay | Admitting: Internal Medicine

## 2022-04-23 DIAGNOSIS — E1165 Type 2 diabetes mellitus with hyperglycemia: Secondary | ICD-10-CM

## 2022-04-23 MED ORDER — METFORMIN HCL 500 MG PO TABS: 500.0000 mg | ORAL_TABLET | Freq: Two times a day (BID) | ORAL | 1 refills | Status: DC

## 2022-04-25 NOTE — Telephone Encounter (Unsigned)
Copied from [place] [id number]. Topic: General - Other >> Apr 25, 2022 12:29 PM Sabas Sous wrote: Reason for CRM: Pharmacy called for clarification on Rx for Metformin, please advise   Ask for [facility]

## 2022-04-26 ENCOUNTER — Other Ambulatory Visit: Payer: Self-pay

## 2022-04-26 DIAGNOSIS — E1165 Type 2 diabetes mellitus with hyperglycemia: Secondary | ICD-10-CM

## 2022-04-26 MED ORDER — METFORMIN HCL 500 MG PO TABS: 500.0000 mg | ORAL_TABLET | Freq: Two times a day (BID) | ORAL | 1 refills | Status: DC

## 2022-04-30 NOTE — Progress Notes (Unsigned)
Established Patient Office Visit  Subjective    Patient ID: Bethany Nash, female    DOB: September 07, 1960  Age: 61 y.o. MRN: [id number]  CC:  No chief complaint on file.   HPI Bethany Nash presents to follow up on diabetes.    Diabetes, Type 2: -Last A1c >14.0 11/23 -Medications: Metformin 500 mg BID -Diet: working on cutting out sugar and carbs, just started a few days ago  -Exercise: None currently, does like to swim but hasn't found anywhere here yet to swim -Eye exam: Due, discuss at follow up  -Foot exam: Due, discuss at follow up  -Microalbumin: Due, discuss at follow up -Statin: No, discussed cholesterol results  -PNA vaccine: Discuss at follow up -Denies symptoms of hypoglycemia, polyuria, polydipsia, numbness extremities, foot ulcers/trauma.   HLD: -Medications: Nothing  -Last lipid panel: Lipid Panel     Component Value Date/Time   CHOL 258 (H) 03/27/2022 1031   CHOL 232 (H) 12/25/2019 1011   TRIG 370 (H) 03/27/2022 1031   HDL 38 (L) 03/27/2022 1031   HDL 36 (L) 12/25/2019 1011   CHOLHDL 6.8 (H) 03/27/2022 1031   LDLCALC 157 (H) 03/27/2022 1031   LABVLDL 43 (H) 12/25/2019 1011   The 10-year ASCVD risk score (Arnett DK, et al., 2019) is: 9%   Values used to calculate the score:     Age: 20 years     Sex: Female     Is Non-Hispanic African American: No     Diabetic: Yes     Tobacco smoker: No     Systolic Blood Pressure: 010 mmHg     Is BP treated: No     HDL Cholesterol: 38 mg/dL     Total Cholesterol: 258 mg/dL    Outpatient Encounter Medications as of 05/01/2022  Medication Sig   metFORMIN (GLUCOPHAGE) 500 MG tablet Take 1 tablet (500 mg total) by mouth 2 (two) times daily with a meal.   Multiple Vitamins-Minerals (BODY/HAIR/SKIN/NAILS PO) Take 1 tablet by mouth daily.   No facility-administered encounter medications on file as of 05/01/2022.    Past Medical History:  Diagnosis Date   Obesity    Type 2 diabetes mellitus with hyperglycemia, without  long-term current use of insulin ([place]) 04/03/2022    Past Surgical History:  Procedure Laterality Date   ABDOMINAL HYSTERECTOMY  1992   still has ovaries   ANKLE ARTHROSCOPY WITH FUSION  2007   CESAREAN SECTION     CHOLECYSTECTOMY  2008   COLONOSCOPY WITH PROPOFOL N/A 01/27/2019   Procedure: COLONOSCOPY WITH PROPOFOL;  Surgeon: Lin Landsman, MD;  Location: [facility] ENDOSCOPY;  Service: Gastroenterology;  Laterality: N/A;   FRACTURE SURGERY  1967, 1970, 2006   TUBAL LIGATION  1989    Family History  Problem Relation Age of Onset   Cancer Mother 63       unsure where it started   Heart disease Father    Alcoholism Father    Stomach cancer Brother    Cancer Brother    Breast cancer Neg Hx    Ovarian cancer Neg Hx    Colon cancer Neg Hx     Social History   Socioeconomic History   Marital status: Divorced    Spouse name: Not on file   Number of children: Not on file   Years of education: Not on file   Highest education level: Not on file  Occupational History   Not on file  Tobacco Use   Smoking status: Never  Smokeless tobacco: Never   Tobacco comments:    Never smoked  Vaping Use   Vaping Use: Never used  Substance and Sexual Activity   Alcohol use: Yes    Alcohol/week: 1.0 standard drink of alcohol    Types: 1 Standard drinks or equivalent per week    Comment: Less than 1 per week on average   Drug use: Never   Sexual activity: Not Currently    Birth control/protection: Surgical  Other Topics Concern   Not on file  Social History Narrative   Not on file   Social Determinants of Health   Financial Resource Strain: Not on file  Food Insecurity: Not on file  Transportation Needs: Not on file  Physical Activity: Not on file  Stress: Not on file  Social Connections: Not on file  Intimate Partner Violence: Not on file    Review of Systems  Constitutional:  Positive for weight loss. Negative for chills, fever and malaise/fatigue.  Eyes:  Negative  for blurred vision.  Respiratory:  Negative for shortness of breath.   Cardiovascular:  Negative for chest pain.  Gastrointestinal:  Negative for abdominal pain.      Objective    There were no vitals taken for this visit.  Physical Exam Constitutional:      Appearance: Normal appearance.  HENT:     Head: Normocephalic and atraumatic.  Eyes:     Conjunctiva/sclera: Conjunctivae normal.  Cardiovascular:     Rate and Rhythm: Normal rate and regular rhythm.  Pulmonary:     Effort: Pulmonary effort is normal.     Breath sounds: Normal breath sounds.  Skin:    General: Skin is warm and dry.  Neurological:     General: No focal deficit present.     Mental Status: She is alert. Mental status is at baseline.  Psychiatric:        Mood and Affect: Mood normal.        Behavior: Behavior normal.         Assessment & Plan:   1. Type 2 diabetes mellitus with hyperglycemia, without long-term current use of insulin [facility]): Discussed etiology and pathology of diabetes. Discussed nutrition and diet changes, patient has started working on diet the last few days. Start Metformin 500 mg once daily for 2 weeks, patient will then increase to 500 mg twice daily as tolerated.  Follow-up here in 1 month to discuss further medication options as well as diabetic preventative tasks.  - metFORMIN (GLUCOPHAGE) 500 MG tablet; Take 1 tablet (500 mg total) by mouth daily with breakfast.  Dispense: 30 tablet; Refill: 1   No follow-ups on file.   Teodora Medici, DO

## 2022-05-01 ENCOUNTER — Ambulatory Visit: Payer: BC Managed Care – PPO | Admitting: Internal Medicine

## 2022-05-01 ENCOUNTER — Encounter: Payer: Self-pay | Admitting: Internal Medicine

## 2022-05-01 VITALS — BP 118/78 | HR 107 | Temp 98.1°F | Resp 16 | Ht 67.0 in | Wt 264.0 lb

## 2022-05-01 DIAGNOSIS — Z23 Encounter for immunization: Secondary | ICD-10-CM | POA: Diagnosis not present

## 2022-05-01 DIAGNOSIS — E1165 Type 2 diabetes mellitus with hyperglycemia: Secondary | ICD-10-CM | POA: Diagnosis not present

## 2022-05-01 MED ORDER — METFORMIN HCL ER 500 MG PO TB24: 500.0000 mg | ORAL_TABLET | Freq: Two times a day (BID) | ORAL | 1 refills | Status: DC

## 2022-05-01 NOTE — Patient Instructions (Signed)
It was great seeing you today!  Plan discussed at today's visit: -Blood work ordered today, results will be uploaded to [facility].   Follow up in: 2 months   Take care and let us know if you have any questions or concerns prior to your next visit.  Dr. Rosana Berger

## 2022-05-10 ENCOUNTER — Ambulatory Visit: Payer: BC Managed Care – PPO | Admitting: Nurse Practitioner

## 2022-05-10 ENCOUNTER — Encounter: Payer: Self-pay | Admitting: Nurse Practitioner

## 2022-05-10 VITALS — BP 124/76 | HR 97 | Temp 97.0°F | Resp 18 | Ht 67.0 in | Wt 263.8 lb

## 2022-05-10 DIAGNOSIS — K047 Periapical abscess without sinus: Secondary | ICD-10-CM

## 2022-05-10 MED ORDER — AMOXICILLIN-POT CLAVULANATE 875-125 MG PO TABS: 1.0000 | ORAL_TABLET | Freq: Two times a day (BID) | ORAL | 0 refills | Status: DC

## 2022-05-10 NOTE — Progress Notes (Signed)
BP 124/76   Pulse 97   Temp (!) 97 F (36.1 C)   Resp 18   Ht _0  (1.702 m)   Wt 263 lb 12.8 oz (119.7 kg)   SpO2 98%   BMI 41.32 kg/m    Subjective:    Patient ID: Bethany Nash, female    DOB: [birth year], 61 y.o.   MRN: [id number]  HPI: Bethany Nash is a 61 y.o. female  Chief Complaint  Patient presents with   Dental Problem    Pain started yesterday   Dental pain/infection: patient reports she has had problems with her teeth before.  She says she sees her dentist regularly.  She says that she can tell that she has an abscess tooth. She says the pain started yesterday.  She says she has a bad tooth in the left upper posterior. There is some swelling and dental caries present.  Will start her on Augmentin she is going to reach out to her dentist.  She can continue to take ibuprofen for pain.   Relevant past medical, surgical, family and social history reviewed and updated as indicated. Interim medical history since our last visit reviewed. Allergies and medications reviewed and updated.  Review of Systems  Constitutional: Negative for fever or weight change.  HEENT: positive for dental pain Respiratory: Negative for cough and shortness of breath.   Cardiovascular: Negative for chest pain or palpitations.  Gastrointestinal: Negative for abdominal pain, no bowel changes.  Musculoskeletal: Negative for gait problem or joint swelling.  Skin: Negative for rash.  Neurological: Negative for dizziness or headache.  No other specific complaints in a complete review of systems (except as listed in HPI above).      Objective:    BP 124/76   Pulse 97   Temp (!) 97 F (36.1 C)   Resp 18   Ht _1  (1.702 m)   Wt 263 lb 12.8 oz (119.7 kg)   SpO2 98%   BMI 41.32 kg/m   Wt Readings from Last 3 Encounters:  05/10/22 263 lb 12.8 oz (119.7 kg)  05/01/22 264 lb (119.7 kg)  04/03/22 264 lb 11.2 oz (120.1 kg)    Physical Exam  Constitutional: Patient appears well-developed and  well-nourished. Obese  No distress.  HEENT: head atraumatic, normocephalic, pupils equal and reactive to light, neck supple, throat within normal limits, dental caries, left upper gum swelling and discoloration of tooth.  Cardiovascular: Normal rate, regular rhythm and normal heart sounds.  No murmur heard. No BLE edema. Pulmonary/Chest: Effort normal and breath sounds normal. No respiratory distress. Abdominal: Soft.  There is no tenderness. Psychiatric: Patient has a normal mood and affect. behavior is normal. Judgment and thought content normal.  Results for orders placed or performed in visit on 03/27/22  HIV antibody (with reflex)  Result Value Ref Range   HIV 1&2 Ab, 4th Generation NON-REACTIVE NON-REACTIVE  Hepatitis C Antibody  Result Value Ref Range   Hepatitis C Ab NON-REACTIVE NON-REACTIVE  CBC w/Diff/Platelet  Result Value Ref Range   WBC 5.6 3.8 - 10.8 Thousand/uL   RBC 4.71 3.80 - 5.10 Million/uL   Hemoglobin 14.1 11.7 - 15.5 g/dL   HCT 41.9 35.0 - 45.0 %   MCV 89.0 80.0 - 100.0 fL   MCH 29.9 27.0 - 33.0 pg   MCHC 33.7 32.0 - 36.0 g/dL   RDW 12.6 11.0 - 15.0 %   Platelets 203 140 - 400 Thousand/uL   MPV 11.5 7.5 - 12.5 fL  Neutro Abs 3,158 1,500 - 7,800 cells/uL   Lymphs Abs 1,882 850 - 3,900 cells/uL   Absolute Monocytes 442 200 - 950 cells/uL   Eosinophils Absolute 90 15 - 500 cells/uL   Basophils Absolute 28 0 - 200 cells/uL   Neutrophils Relative % 56.4 %   Total Lymphocyte 33.6 %   Monocytes Relative 7.9 %   Eosinophils Relative 1.6 %   Basophils Relative 0.5 %  COMPLETE METABOLIC PANEL WITH GFR  Result Value Ref Range   Glucose, Bld 416 (H) 65 - 99 mg/dL   BUN 10 7 - 25 mg/dL   Creat 0.82 0.50 - 1.05 mg/dL   eGFR 81 > OR = 60 mL/min/1.40m   BUN/Creatinine Ratio SEE NOTE: 6 - 22 (calc)   Sodium 135 135 - 146 mmol/L   Potassium 4.6 3.5 - 5.3 mmol/L   Chloride 100 98 - 110 mmol/L   CO2 26 20 - 32 mmol/L   Calcium 9.5 8.6 - 10.4 mg/dL   Total Protein  6.8 6.1 - 8.1 g/dL   Albumin 3.9 3.6 - 5.1 g/dL   Globulin 2.9 1.9 - 3.7 g/dL (calc)   AG Ratio 1.3 1.0 - 2.5 (calc)   Total Bilirubin 0.4 0.2 - 1.2 mg/dL   Alkaline phosphatase (APISO) 103 37 - 153 U/L   AST 16 10 - 35 U/L   ALT 15 6 - 29 U/L  Lipid Profile  Result Value Ref Range   Cholesterol 258 (H) <200 mg/dL   HDL 38 (L) > OR = 50 mg/dL   Triglycerides 370 (H) <150 mg/dL   LDL Cholesterol (Calc) 157 (H) mg/dL (calc)   Total CHOL/HDL Ratio 6.8 (H) <5.0 (calc)   Non-HDL Cholesterol (Calc) 220 (H) <130 mg/dL (calc)  TSH  Result Value Ref Range   TSH 1.57 0.40 - 4.50 mIU/L  HgB A1c  Result Value Ref Range   Hgb A1c MFr Bld >14.0 (H) <5.7 % of total Hgb   Mean Plasma Glucose  mg/dL      Assessment & Plan:   Problem List Items Addressed This Visit   None Visit Diagnoses     Dental abscess    -  Primary   start augmentin, follow up with dentist can continue ibuprofen for pain   Relevant Medications   amoxicillin-clavulanate (AUGMENTIN) 875-125 MG tablet        Follow up plan: Return if symptoms worsen or fail to improve.

## 2022-05-18 LAB — HM DIABETES EYE EXAM

## 2022-06-09 ENCOUNTER — Ambulatory Visit
Admission: RE | Admit: 2022-06-09 | Discharge: 2022-06-09 | Disposition: A | Discharge status: Home / Self Care | Payer: BC Managed Care – PPO | Source: Ambulatory Visit | Attending: Internal Medicine | Admitting: Internal Medicine

## 2022-06-09 DIAGNOSIS — Z1231 Encounter for screening mammogram for malignant neoplasm of breast: Secondary | ICD-10-CM | POA: Insufficient documentation

## 2022-07-03 NOTE — Progress Notes (Unsigned)
Established Patient Office Visit  Subjective    Patient ID: Bethany Nash, female    DOB: 22-Oct-1960  Age: 62 y.o. MRN: [id number]  CC:  No chief complaint on file.   HPI [name] presents to follow up on diabetes.   Diabetes, Type 2: -Last A1c >14.0 11/23 -Medications: Metformin 500 mg BID XR -Diet: working on cutting out sugar and carbs -Exercise: None currently, does like to swim but hasn't found anywhere here yet to swim -Eye exam: Has appointment in early January  -Foot exam: UTD 12/23 -Microalbumin: Due at follow-up only have lab services available -Statin: No -PNA vaccine: Due today -Denies symptoms of hypoglycemia, polyuria, polydipsia, numbness extremities, foot ulcers/trauma.   HLD: -Medications: Nothing  -Last lipid panel: Lipid Panel     Component Value Date/Time   CHOL 258 (H) 03/27/2022 1031   CHOL 232 (H) 12/25/2019 1011   TRIG 370 (H) 03/27/2022 1031   HDL 38 (L) 03/27/2022 1031   HDL 36 (L) 12/25/2019 1011   CHOLHDL 6.8 (H) 03/27/2022 1031   LDLCALC 157 (H) 03/27/2022 1031   LABVLDL 43 (H) 12/25/2019 1011   The 10-year ASCVD risk score (Arnett DK, et al., 2019) is: 10.5%   Values used to calculate the score:     Age: 68 years     Sex: Female     Is Non-Hispanic African American: No     Diabetic: Yes     Tobacco smoker: No     Systolic Blood Pressure: [id number] mmHg     Is BP treated: No     HDL Cholesterol: 38 mg/dL     Total Cholesterol: 258 mg/dL    Outpatient Encounter Medications as of 07/04/2022  Medication Sig   amoxicillin-clavulanate (AUGMENTIN) 875-125 MG tablet Take 1 tablet by mouth 2 (two) times daily.   metFORMIN (GLUCOPHAGE-XR) 500 MG 24 hr tablet Take 1 tablet (500 mg total) by mouth 2 (two) times daily with a meal.   Multiple Vitamins-Minerals (BODY/HAIR/SKIN/NAILS PO) Take 1 tablet by mouth daily.   No facility-administered encounter medications on file as of 07/04/2022.    Past Medical History:  Diagnosis Date   Obesity     Type 2 diabetes mellitus with hyperglycemia, without long-term current use of insulin ([place]) 04/03/2022    Past Surgical History:  Procedure Laterality Date   ABDOMINAL HYSTERECTOMY  1992   still has ovaries   ANKLE ARTHROSCOPY WITH FUSION  2007   CESAREAN SECTION     CHOLECYSTECTOMY  2008   COLONOSCOPY WITH PROPOFOL N/A 01/27/2019   Procedure: COLONOSCOPY WITH PROPOFOL;  Surgeon: Lin Landsman, MD;  Location: [facility] ENDOSCOPY;  Service: Gastroenterology;  Laterality: N/A;   FRACTURE SURGERY  1967, 1970, 2006   TUBAL LIGATION  1989    Family History  Problem Relation Age of Onset   Cancer Mother 54       unsure where it started   Heart disease Father    Alcoholism Father    Stomach cancer Brother    Cancer Brother    Breast cancer Neg Hx    Ovarian cancer Neg Hx    Colon cancer Neg Hx     Social History   Socioeconomic History   Marital status: Divorced    Spouse name: Not on file   Number of children: Not on file   Years of education: Not on file   Highest education level: Not on file  Occupational History   Not on file  Tobacco Use  Smoking status: Never   Smokeless tobacco: Never   Tobacco comments:    Never smoked  Vaping Use   Vaping Use: Never used  Substance and Sexual Activity   Alcohol use: Yes    Alcohol/week: 1.0 standard drink of alcohol    Types: 1 Standard drinks or equivalent per week    Comment: Less than 1 per week on average   Drug use: Never   Sexual activity: Not Currently    Birth control/protection: Surgical  Other Topics Concern   Not on file  Social History Narrative   Not on file   Social Determinants of Health   Financial Resource Strain: Not on file  Food Insecurity: Not on file  Transportation Needs: Not on file  Physical Activity: Not on file  Stress: Not on file  Social Connections: Not on file  Intimate Partner Violence: Not on file    Review of Systems  Constitutional:  Negative for chills and fever.  Eyes:   Negative for blurred vision.  Respiratory:  Negative for shortness of breath.   Cardiovascular:  Negative for chest pain.  Gastrointestinal:  Positive for diarrhea.      Objective    There were no vitals taken for this visit.  Physical Exam Constitutional:      Appearance: Normal appearance.  HENT:     Head: Normocephalic and atraumatic.  Eyes:     Conjunctiva/sclera: Conjunctivae normal.  Cardiovascular:     Rate and Rhythm: Normal rate and regular rhythm.     Pulses:          Dorsalis pedis pulses are 2+ on the right side and 2+ on the left side.  Pulmonary:     Effort: Pulmonary effort is normal.     Breath sounds: Normal breath sounds.  Musculoskeletal:     Right foot: Normal range of motion. No deformity, bunion, Charcot foot, foot drop or prominent metatarsal heads.     Left foot: Normal range of motion. No deformity, bunion, Charcot foot, foot drop or prominent metatarsal heads.  Feet:     Right foot:     Protective Sensation: 6 sites tested.  6 sites sensed.     Skin integrity: Skin integrity normal.     Toenail Condition: Right toenails are normal.     Left foot:     Protective Sensation: 6 sites tested.  6 sites sensed.     Skin integrity: Skin integrity normal.     Toenail Condition: Left toenails are normal.  Skin:    General: Skin is warm and dry.  Neurological:     General: No focal deficit present.     Mental Status: She is alert. Mental status is at baseline.  Psychiatric:        Mood and Affect: Mood normal.        Behavior: Behavior normal.         Assessment & Plan:   1. Type 2 diabetes mellitus with hyperglycemia, without long-term current use of insulin ([place]): Patient resistant to increasing metformin but will change to extended release version as she is having abdominal side effects.  Foot exam today.  Discussed next set of medications she may require.  She will follow-up in 2 months to recheck A1c.  - HM Diabetes Foot Exam - metFORMIN  (GLUCOPHAGE-XR) 500 MG 24 hr tablet; Take 1 tablet (500 mg total) by mouth 2 (two) times daily with a meal.  Dispense: 90 tablet; Refill: 1  2. Vaccine for streptococcus pneumoniae  and influenza: Prevnar vaccine administered today.  - Pneumococcal conjugate vaccine 20-valent (Prevnar 20)   No follow-ups on file.   Teodora Medici, DO

## 2022-07-04 ENCOUNTER — Encounter: Payer: Self-pay | Admitting: Internal Medicine

## 2022-07-04 ENCOUNTER — Ambulatory Visit: Payer: BC Managed Care – PPO | Admitting: Internal Medicine

## 2022-07-04 VITALS — BP 126/72 | HR 100 | Temp 97.9°F | Resp 16 | Ht 67.0 in | Wt 263.7 lb

## 2022-07-04 DIAGNOSIS — E1165 Type 2 diabetes mellitus with hyperglycemia: Secondary | ICD-10-CM

## 2022-07-04 LAB — POCT GLYCOSYLATED HEMOGLOBIN (HGB A1C): Hemoglobin A1C: 8.1 % — AB (ref 4.0–5.6)

## 2022-07-04 NOTE — Patient Instructions (Addendum)
It was great seeing you today!  Plan discussed at today's visit: -A1c 8.1% which is excellent -Continue Metformin as you are now -Continue to work on lifestyle changes    Follow up in: 3 months   Take care and let us know if you have any questions or concerns prior to your next visit.  Dr. Rosana Berger

## 2022-07-05 LAB — MICROALBUMIN / CREATININE URINE RATIO
Creatinine, Urine: 48 mg/dL (ref 20–275)
Microalb, Ur: <0.2 mg/dL

## 2022-07-06 ENCOUNTER — Other Ambulatory Visit: Payer: Self-pay | Admitting: Internal Medicine

## 2022-07-06 DIAGNOSIS — E1165 Type 2 diabetes mellitus with hyperglycemia: Secondary | ICD-10-CM

## 2022-07-06 NOTE — Telephone Encounter (Signed)
Medication was changed to XL form and daily dosage increased. Requested Prescriptions  Pending Prescriptions Disp Refills   metFORMIN (GLUCOPHAGE) 500 MG tablet [Pharmacy Med Name: METFORMIN HCL 500 MG TABLET] 30 tablet 1    Sig: TAKE 1 TABLET BY MOUTH EVERY DAY WITH BREAKFAST     Endocrinology:  Diabetes - Biguanides Failed - 07/06/2022  2:35 AM      Failed - HBA1C is between 0 and 7.9 and within 180 days    Hemoglobin A1C  Date Value Ref Range Status  07/04/2022 8.1 (A) 4.0 - 5.6 % Final   Hgb A1c MFr Bld  Date Value Ref Range Status  03/27/2022 >14.0 (H) <5.7 % of total Hgb Final    Comment:    Verified by repeat analysis. . For someone without known diabetes, a hemoglobin A1c value of 6.5% or greater indicates that they may have  diabetes and this should be confirmed with a follow-up  test. . For someone with known diabetes, a value <7% indicates  that their diabetes is well controlled and a value  greater than or equal to 7% indicates suboptimal  control. A1c targets should be individualized based on  duration of diabetes, age, comorbid conditions, and  other considerations. . Currently, no consensus exists regarding use of hemoglobin A1c for diagnosis of diabetes for children. .          Failed - B12 Level in normal range and within 720 days    No results found for: "VITAMINB12"       Passed - Cr in normal range and within 360 days    Creat  Date Value Ref Range Status  03/27/2022 0.82 0.50 - 1.05 mg/dL Final   Creatinine, Urine  Date Value Ref Range Status  07/04/2022 48 20 - 275 mg/dL Final         Passed - eGFR in normal range and within 360 days    GFR calc Af Amer  Date Value Ref Range Status  12/25/2019 89 >59 mL/min/1.73 Final    Comment:    **[facility] currently reports eGFR in compliance with the current**   recommendations of the [employer]. [facility] will   update reporting as new guidelines are published from the NKF-ASN   Task  force.    GFR calc non Af Amer  Date Value Ref Range Status  12/25/2019 77 >59 mL/min/1.73 Final   eGFR  Date Value Ref Range Status  03/27/2022 81 > OR = 60 mL/min/1.53m Final         Passed - Valid encounter within last 6 months    Recent Outpatient Visits           2 days ago Type 2 diabetes mellitus with hyperglycemia, without long-term current use of insulin ([facility]   [facility]   1 month ago [facility] abscess   [facility]   2 months ago Type 2 diabetes mellitus with hyperglycemia, without long-term current use of insulin ([facility]   [facility]   3 months ago Type 2 diabetes mellitus with hyperglycemia, without long-term current use of insulin ([facility]   CRyan DO   3 months ago Weight loss, non-intentional   [facility]       Future Appointments             In 3  months Teodora Medici, [facility], [place]   In 8 months Teodora Medici, [facility], [place] within normal limits and completed in the last 12 months    WBC  Date Value Ref Range Status  03/27/2022 5.6 3.8 - 10.8 Thousand/uL Final   RBC  Date Value Ref Range Status  03/27/2022 4.71 3.80 - 5.10 Million/uL Final   Hemoglobin  Date Value Ref Range Status  03/27/2022 14.1 11.7 - 15.5 g/dL Final   HCT  Date Value Ref Range Status  03/27/2022 41.9 35.0 - 45.0 % Final   MCHC  Date Value Ref Range Status  03/27/2022 33.7 32.0 - 36.0 g/dL Final   [facility]  Date Value Ref Range Status  03/27/2022 29.9 27.0 - 33.0 pg Final   MCV  Date Value Ref Range Status  03/27/2022 89.0 80.0 - 100.0 fL Final   No results found for: "PLTCOUNTKUC", "LABPLAT", "POCPLA" RDW  Date Value Ref  Range Status  03/27/2022 12.6 11.0 - 15.0 % Final

## 2022-08-01 ENCOUNTER — Other Ambulatory Visit: Payer: Self-pay | Admitting: Internal Medicine

## 2022-08-01 DIAGNOSIS — E1165 Type 2 diabetes mellitus with hyperglycemia: Secondary | ICD-10-CM

## 2022-08-01 NOTE — Telephone Encounter (Signed)
Requested Prescriptions  Pending Prescriptions Disp Refills   metFORMIN (GLUCOPHAGE-XR) 500 MG 24 hr tablet [Pharmacy Med Name: METFORMIN HCL ER 500 MG TABLET] 90 tablet 1    Sig: TAKE 1 TABLET BY MOUTH 2 TIMES DAILY WITH A MEAL.     Endocrinology:  Diabetes - Biguanides Failed - 08/01/2022  2:07 AM      Failed - HBA1C is between 0 and 7.9 and within 180 days    Hemoglobin A1C  Date Value Ref Range Status  07/04/2022 8.1 (A) 4.0 - 5.6 % Final   Hgb A1c MFr Bld  Date Value Ref Range Status  03/27/2022 >14.0 (H) <5.7 % of total Hgb Final    Comment:    Verified by repeat analysis. . For someone without known diabetes, a hemoglobin A1c value of 6.5% or greater indicates that they may have  diabetes and this should be confirmed with a follow-up  test. . For someone with known diabetes, a value <7% indicates  that their diabetes is well controlled and a value  greater than or equal to 7% indicates suboptimal  control. A1c targets should be individualized based on  duration of diabetes, age, comorbid conditions, and  other considerations. . Currently, no consensus exists regarding use of hemoglobin A1c for diagnosis of diabetes for children. .          Failed - B12 Level in normal range and within 720 days    No results found for: "VITAMINB12"       Passed - Cr in normal range and within 360 days    Creat  Date Value Ref Range Status  03/27/2022 0.82 0.50 - 1.05 mg/dL Final   Creatinine, Urine  Date Value Ref Range Status  07/04/2022 48 20 - 275 mg/dL Final         Passed - eGFR in normal range and within 360 days    GFR calc Af Amer  Date Value Ref Range Status  12/25/2019 89 >59 mL/min/1.73 Final    Comment:    **[facility] currently reports eGFR in compliance with the current**   recommendations of the [employer]. [facility] will   update reporting as new guidelines are published from the NKF-ASN   Task force.    GFR calc non Af Amer  Date Value Ref  Range Status  12/25/2019 77 >59 mL/min/1.73 Final   eGFR  Date Value Ref Range Status  03/27/2022 81 > OR = 60 mL/min/1.94m2 Final         Passed - Valid encounter within last 6 months    Recent Outpatient Visits           4 weeks ago Type 2 diabetes mellitus with hyperglycemia, without long-term current use of insulin [facility])   [facility] Teodora Medici, DO   2 months ago Dental abscess   [facility] Serafina Royals F, FNP   3 months ago Type 2 diabetes mellitus with hyperglycemia, without long-term current use of insulin [facility])   [facility] Teodora Medici, DO   4 months ago Type 2 diabetes mellitus with hyperglycemia, without long-term current use of insulin [facility])   [place], DO   4 months ago Weight loss, non-intentional   [facility] Teodora Medici, DO       Future Appointments             In 2 months Teodora Medici, DO [facility], [place]   In 8 months Teodora Medici, [facility], [place] within normal limits and completed in the last 12 months    WBC  Date Value Ref Range Status  03/27/2022 5.6 3.8 - 10.8 Thousand/uL Final   RBC  Date Value Ref Range Status  03/27/2022 4.71 3.80 - 5.10 Million/uL Final   Hemoglobin  Date Value Ref Range Status  03/27/2022 14.1 11.7 - 15.5 g/dL Final   HCT  Date Value Ref Range Status  03/27/2022 41.9 35.0 - 45.0 % Final   MCHC  Date Value Ref Range Status  03/27/2022 33.7 32.0 - 36.0 g/dL Final   [facility]  Date Value Ref Range Status  03/27/2022 29.9 27.0 - 33.0 pg Final   MCV  Date Value Ref Range Status  03/27/2022 89.0 80.0 - 100.0 fL Final   No results found for: "PLTCOUNTKUC", "LABPLAT", "POCPLA" RDW  Date Value Ref Range Status  03/27/2022 12.6 11.0 - 15.0 % Final

## 2022-08-11 DIAGNOSIS — D2272 Melanocytic nevi of left lower limb, including hip: Secondary | ICD-10-CM | POA: Diagnosis not present

## 2022-08-11 DIAGNOSIS — D485 Neoplasm of uncertain behavior of skin: Secondary | ICD-10-CM | POA: Diagnosis not present

## 2022-08-11 DIAGNOSIS — L578 Other skin changes due to chronic exposure to nonionizing radiation: Secondary | ICD-10-CM | POA: Diagnosis not present

## 2022-08-21 DIAGNOSIS — D235 Other benign neoplasm of skin of trunk: Secondary | ICD-10-CM | POA: Diagnosis not present

## 2022-08-21 DIAGNOSIS — D225 Melanocytic nevi of trunk: Secondary | ICD-10-CM | POA: Diagnosis not present

## 2022-10-03 NOTE — Progress Notes (Unsigned)
Established Patient Office Visit  Subjective    Patient ID: Bethany Nash, female    DOB: [birth year]  Age: 62 y.o. MRN: [id number]  CC:  No chief complaint on file.   HPI Bethany Nash presents to follow up on diabetes.   Diabetes, Type 2: -Last A1c >14.0 11/23 -Medications: Metformin 500 mg BID XR -Diet: working on cutting out sugar and carbs -Exercise: None currently, does like to swim but hasn't found anywhere here yet to swim. Wants to start walking soon when the weather gets better.  -Eye exam: UTD 1/24 -Foot exam: UTD 12/23 -Microalbumin: Due today -PNA vaccine: UTD -Denies symptoms of hypoglycemia, polyuria, polydipsia, numbness extremities, foot ulcers/trauma.   HLD: -Medications: Nothing  -Last lipid panel: Lipid Panel     Component Value Date/Time   CHOL 258 (H) 03/27/2022 1031   CHOL 232 (H) 12/25/2019 1011   TRIG 370 (H) 03/27/2022 1031   HDL 38 (L) 03/27/2022 1031   HDL 36 (L) 12/25/2019 1011   CHOLHDL 6.8 (H) 03/27/2022 1031   LDLCALC 157 (H) 03/27/2022 1031   LABVLDL 43 (H) 12/25/2019 1011   The 10-year ASCVD risk score (Arnett DK, et al., 2019) is: 10.9%   Values used to calculate the score:     Age: 24 years     Sex: Female     Is Non-Hispanic African American: No     Diabetic: Yes     Tobacco smoker: No     Systolic Blood Pressure: 126 mmHg     Is BP treated: No     HDL Cholesterol: 38 mg/dL     Total Cholesterol: 258 mg/dL    Outpatient Encounter Medications as of 10/04/2022  Medication Sig   metFORMIN (GLUCOPHAGE-XR) 500 MG 24 hr tablet TAKE 1 TABLET BY MOUTH 2 TIMES DAILY WITH A MEAL.   Multiple Vitamins-Minerals (BODY/HAIR/SKIN/NAILS PO) Take 1 tablet by mouth daily.   No facility-administered encounter medications on file as of 10/04/2022.    Past Medical History:  Diagnosis Date   Obesity    Type 2 diabetes mellitus with hyperglycemia, without long-term current use of insulin ([facility]) 04/03/2022    Past Surgical History:  Procedure  Laterality Date   ABDOMINAL HYSTERECTOMY  1992   still has ovaries   ANKLE ARTHROSCOPY WITH FUSION  2007   CESAREAN SECTION     CHOLECYSTECTOMY  2008   COLONOSCOPY WITH PROPOFOL N/A 01/27/2019   Procedure: COLONOSCOPY WITH PROPOFOL;  Surgeon: Toney Reil, MD;  Location[facility] ENDOSCOPY;  Service: Gastroenterology;  Laterality: N/A;   FRACTURE SURGERY  1967, 1970, 2006   TUBAL LIGATION  1989    Family History  Problem Relation Age of Onset   Cancer Mother 31       unsure where it started   Heart disease Father    Alcoholism Father    Stomach cancer Brother    Cancer Brother    Breast cancer Neg Hx    Ovarian cancer Neg Hx    Colon cancer Neg Hx     Social History   Socioeconomic History   Marital status: Divorced    Spouse name: Not on file   Number of children: Not on file   Years of education: Not on file   Highest education level: Associate degree: occupational, Scientist, product/process development, or vocational program  Occupational History   Not on file  Tobacco Use   Smoking status: Never   Smokeless tobacco: Never   Tobacco comments:    Never smoked  Vaping Use   Vaping Use: Never used  Substance and Sexual Activity   Alcohol use: Yes    Alcohol/week: 1.0 standard drink of alcohol    Types: 1 Standard drinks or equivalent per week    Comment: Less than 1 per week on average   Drug use: Never   Sexual activity: Not Currently    Birth control/protection: Surgical  Other Topics Concern   Not on file  Social History Narrative   Not on file   Social Determinants of Health   Financial Resource Strain: Low Risk  (10/02/2022)   Overall Financial Resource Strain (CARDIA)    Difficulty of Paying Living Expenses: Not very hard  Food Insecurity: No Food Insecurity (10/02/2022)   Hunger Vital Sign    Worried About Running Out of Food in the Last Year: Never true    Ran Out of Food in the Last Year: Never true  Transportation Needs: No Transportation Needs (10/02/2022)   PRAPARE -  Administrator, Civil Service (Medical): No    Lack of Transportation (Non-Medical): No  Physical Activity: Insufficiently Active (10/02/2022)   Exercise Vital Sign    Days of Exercise per Week: 3 days    Minutes of Exercise per Session: 20 min  Stress: No Stress Concern Present (10/02/2022)   [employer] of Occupational Health - Occupational Stress Questionnaire    Feeling of Stress : Not at all  Social Connections: Socially Isolated (10/02/2022)   Social Connection and Isolation Panel [NHANES]    Frequency of Communication with Friends and Family: More than three times a week    Frequency of Social Gatherings with Friends and Family: More than three times a week    Attends Religious Services: Never    Database administrator or Organizations: No    Attends Engineer, structural: Not on file    Marital Status: Divorced  Intimate Partner Violence: Not on file    Review of Systems  Constitutional:  Negative for chills and fever.  Eyes:  Negative for blurred vision.  Respiratory:  Negative for shortness of breath.   Cardiovascular:  Negative for chest pain.  Gastrointestinal:  Negative for abdominal pain, diarrhea, nausea and vomiting.      Objective    There were no vitals taken for this visit.  Physical Exam Constitutional:      Appearance: Normal appearance.  HENT:     Head: Normocephalic and atraumatic.  Eyes:     Conjunctiva/sclera: Conjunctivae normal.  Cardiovascular:     Rate and Rhythm: Normal rate and regular rhythm.  Pulmonary:     Effort: Pulmonary effort is normal.     Breath sounds: Normal breath sounds.  Skin:    General: Skin is warm and dry.  Neurological:     General: No focal deficit present.     Mental Status: She is alert. Mental status is at baseline.  Psychiatric:        Mood and Affect: Mood normal.        Behavior: Behavior normal.         Assessment & Plan:   1. Type 2 diabetes mellitus with hyperglycemia,  without long-term current use of insulin ([facility]): A1c much improved at 8.1%. Patient wants to continue to work on diet and exercise before changing medication. Will continue Metformin 500 mg XR BID for now and plan to recheck A1c in 3 months. If >8 will discuss either increasing Metformin or adding a second medication. Plan to  recheck lipids in the fall.  - POCT HgB A1C - Urine Microalbumin w/creat. ratio   No follow-ups on file.   Margarita Mail, DO

## 2022-10-04 ENCOUNTER — Ambulatory Visit: Payer: BC Managed Care – PPO | Admitting: Internal Medicine

## 2022-10-04 ENCOUNTER — Encounter: Payer: Self-pay | Admitting: Internal Medicine

## 2022-10-04 VITALS — BP 122/62 | HR 106 | Temp 97.9°F | Resp 18 | Ht 67.0 in | Wt 268.9 lb

## 2022-10-04 DIAGNOSIS — E1165 Type 2 diabetes mellitus with hyperglycemia: Secondary | ICD-10-CM | POA: Diagnosis not present

## 2022-10-04 DIAGNOSIS — Z7984 Long term (current) use of oral hypoglycemic drugs: Secondary | ICD-10-CM

## 2022-10-05 LAB — HEMOGLOBIN A1C
Hgb A1c MFr Bld: 6.5 % of total Hgb — ABNORMAL HIGH (ref <5.7)
Mean Plasma Glucose: 140 mg/dL
eAG (mmol/L): 7.7 mmol/L

## 2022-10-23 ENCOUNTER — Other Ambulatory Visit: Payer: Self-pay | Admitting: Internal Medicine

## 2022-10-23 DIAGNOSIS — E1165 Type 2 diabetes mellitus with hyperglycemia: Secondary | ICD-10-CM

## 2022-10-24 NOTE — Telephone Encounter (Signed)
Requested Prescriptions  Pending Prescriptions Disp Refills   metFORMIN (GLUCOPHAGE-XR) 500 MG 24 hr tablet [Pharmacy Med Name: METFORMIN HCL ER 500 MG TABLET] 180 tablet 1    Sig: TAKE 1 TABLET BY MOUTH TWICE A DAY WITH FOOD     Endocrinology:  Diabetes - Biguanides Failed - 10/23/2022  8:31 AM      Failed - B12 Level in normal range and within 720 days    No results found for: "VITAMINB12"       Passed - Cr in normal range and within 360 days    Creat  Date Value Ref Range Status  03/27/2022 0.82 0.50 - 1.05 mg/dL Final   Creatinine, Urine  Date Value Ref Range Status  07/04/2022 48 20 - 275 mg/dL Final         Passed - HBA1C is between 0 and 7.9 and within 180 days    Hgb A1c MFr Bld  Date Value Ref Range Status  10/04/2022 6.5 (H) <5.7 % of total Hgb Final    Comment:    For someone without known diabetes, a hemoglobin A1c value of 6.5% or greater indicates that they may have  diabetes and this should be confirmed with a follow-up  test. . For someone with known diabetes, a value <7% indicates  that their diabetes is well controlled and a value  greater than or equal to 7% indicates suboptimal  control. A1c targets should be individualized based on  duration of diabetes, age, comorbid conditions, and  other considerations. . Currently, no consensus exists regarding use of hemoglobin A1c for diagnosis of diabetes for children. .          Passed - eGFR in normal range and within 360 days    GFR calc Af Amer  Date Value Ref Range Status  12/25/2019 89 >59 mL/min/1.73 Final    Comment:    **[facility] currently reports eGFR in compliance with the current**   recommendations of the [employer]. [facility] will   update reporting as new guidelines are published from the NKF-ASN   Task force.    GFR calc non Af Amer  Date Value Ref Range Status  12/25/2019 77 >59 mL/min/1.73 Final   eGFR  Date Value Ref Range Status  03/27/2022 81 > OR = 60  mL/min/1.50m2 Final         Passed - Valid encounter within last 6 months    Recent Outpatient Visits           2 weeks ago Type 2 diabetes mellitus with hyperglycemia, without long-term current use of insulin [facility])   [place] [facility] Margarita Mail, DO   3 months ago Type 2 diabetes mellitus with hyperglycemia, without long-term current use of insulin [facility])   [place] [facility] Margarita Mail, DO   5 months ago Dental abscess   [facility] Della Goo F, FNP   5 months ago Type 2 diabetes mellitus with hyperglycemia, without long-term current use of insulin [facility])   [place] [facility] Margarita Mail, DO   6 months ago Type 2 diabetes mellitus with hyperglycemia, without long-term current use of insulin [facility])   [place] [facility] Margarita Mail, DO       Future Appointments             In 2 months Margarita Mail, DO [facility]   In 5 months Margarita Mail, DO [facility]  [facility]            Passed - CBC within normal limits and completed in the last 12 months    WBC  Date Value Ref Range Status  03/27/2022 5.6 3.8 - 10.8 Thousand/uL Final   RBC  Date Value Ref Range Status  03/27/2022 4.71 3.80 - 5.10 Million/uL Final   Hemoglobin  Date Value Ref Range Status  03/27/2022 14.1 11.7 - 15.5 g/dL Final   HCT  Date Value Ref Range Status  03/27/2022 41.9 35.0 - 45.0 % Final   MCHC  Date Value Ref Range Status  03/27/2022 33.7 32.0 - 36.0 g/dL Final   [facility]  Date Value Ref Range Status  03/27/2022 29.9 27.0 - 33.0 pg Final   MCV  Date Value Ref Range Status  03/27/2022 89.0 80.0 - 100.0 fL Final   No results found for: "PLTCOUNTKUC", "LABPLAT", "POCPLA" RDW  Date Value Ref Range Status  03/27/2022 12.6 11.0 - 15.0 % Final

## 2022-12-14 ENCOUNTER — Ambulatory Visit
Admission: RE | Admit: 2022-12-14 | Discharge: 2022-12-14 | Disposition: A | Discharge status: Home / Self Care | Payer: BC Managed Care – PPO | Source: Ambulatory Visit

## 2022-12-14 ENCOUNTER — Ambulatory Visit: Payer: Self-pay | Admitting: *Deleted

## 2022-12-14 VITALS — BP 105/69 | HR 91 | Temp 97.8°F | Resp 18

## 2022-12-14 DIAGNOSIS — G510 Bell's palsy: Secondary | ICD-10-CM | POA: Diagnosis not present

## 2022-12-14 MED ORDER — PREDNISONE 20 MG PO TABS: 60.0000 mg | ORAL_TABLET | Freq: Every day | ORAL | 0 refills | Status: AC

## 2022-12-14 NOTE — ED Provider Notes (Signed)
Renaldo Fiddler    CSN: [id number] Arrival date & time: 12/14/22  1715      History   Chief Complaint Chief Complaint  Patient presents with   Eye Problem    Eye watering and now face palsy on left side. I have no left sided weakness - this began with a watering eye and is now including tingling and numbness on my lips and mouth.  I have spoken to a nurse at my primary provider. - Entered by patient    HPI Bethany Nash is a 62 y.o. female.   62 year old female, Bethany Nash, presents to urgent care for evaluation of left-sided face weakness that started Monday, facial droop started Tuesday. Pt has full ROM, MAEW x4, no speech change  The history is provided by the patient. No language interpreter was used.    Past Medical History:  Diagnosis Date   Obesity    Type 2 diabetes mellitus with hyperglycemia, without long-term current use of insulin (HCC) 04/03/2022    Patient Active Problem List   Diagnosis Date Noted   Facial paralysis/Bells palsy 12/14/2022   Type 2 diabetes mellitus with hyperglycemia, without long-term current use of insulin (HCC) 04/03/2022   Encounter for screening colonoscopy    Prediabetes 12/28/2018   High cholesterol 12/28/2018   Low HDL (under 40) 12/28/2018   High triglycerides 12/28/2018   Morbid obesity (HCC) 12/28/2018   Family history of cancer in mother 12/28/2018    Past Surgical History:  Procedure Laterality Date   ABDOMINAL HYSTERECTOMY  1992   still has ovaries   ANKLE ARTHROSCOPY WITH FUSION  2007   CESAREAN SECTION     CHOLECYSTECTOMY  2008   COLONOSCOPY WITH PROPOFOL N/A 01/27/2019   Procedure: COLONOSCOPY WITH PROPOFOL;  Surgeon: Toney Reil, MD;  Location: [facility] ENDOSCOPY;  Service: Gastroenterology;  Laterality: N/A;   FRACTURE SURGERY  1967, 1970, 2006   TUBAL LIGATION  1989    OB History     Gravida  3   Para  3   Term  2   Preterm  1   AB  0   Living  3      SAB  0   IAB  0   Ectopic  0    Multiple  0   Live Births  3            Home Medications    Prior to Admission medications   Medication Sig Start Date End Date Taking? Authorizing Provider  acidophilus (RISAQUAD) CAPS capsule Take by mouth daily.   Yes [provider]  Biotin 1 MG CAPS Take by mouth.   Yes [provider]  predniSONE (DELTASONE) 20 MG tablet Take 3 tablets (60 mg total) by mouth daily with breakfast for 7 days. 12/14/22 12/21/22 Yes Mattison Stuckey, Para March, NP  metFORMIN (GLUCOPHAGE-XR) 500 MG 24 hr tablet TAKE 1 TABLET BY MOUTH TWICE A DAY WITH FOOD 10/24/22   Margarita Mail, DO  Multiple Vitamins-Minerals (BODY/HAIR/SKIN/NAILS PO) Take 1 tablet by mouth daily.    [provider]    Family History Family History  Problem Relation Age of Onset   Cancer Mother 6       unsure where it started   Heart disease Father    Alcoholism Father    Stomach cancer Brother    Cancer Brother    Breast cancer Neg Hx    Ovarian cancer Neg Hx    Colon cancer Neg Hx  Social History Social History   Tobacco Use   Smoking status: Never   Smokeless tobacco: Never   Tobacco comments:    Never smoked  Vaping Use   Vaping status: Never Used  Substance Use Topics   Alcohol use: Yes    Alcohol/week: 1.0 standard drink of alcohol    Types: 1 Standard drinks or equivalent per week    Comment: Less than 1 per week on average   Drug use: Never     Allergies   Patient has no known allergies.   Review of Systems Review of Systems  Constitutional:  Negative for fever.  Neurological:  Positive for facial asymmetry.  All other systems reviewed and are negative.    Physical Exam Triage Vital Signs ED Triage Vitals  Encounter Vitals Group     BP 12/14/22 1730 105/69     Systolic BP Percentile --      Diastolic BP Percentile --      Pulse Rate 12/14/22 1730 91     Resp 12/14/22 1730 18     Temp 12/14/22 1730 97.8 F (36.6 C)     Temp src --      SpO2 12/14/22 1730  97 %     Weight --      Height --      Head Circumference --      Peak Flow --      Pain Score 12/14/22 1724 0     Pain Loc --      Pain Education --      Exclude from Growth Chart --    No data found.  Updated Vital Signs BP 105/69   Pulse 91   Temp 97.8 F (36.6 C)   Resp 18   SpO2 97%   Visual Acuity Right Eye Distance:   Left Eye Distance:   Bilateral Distance:    Right Eye Near:   Left Eye Near:    Bilateral Near:     Physical Exam Vitals and nursing note reviewed.  Constitutional:      General: She is not in acute distress.    Appearance: She is well-developed and well-groomed. She is obese.  HENT:     Head: Normocephalic and atraumatic.  Eyes:     General: Lids are normal. Vision grossly intact.     Extraocular Movements: Extraocular movements intact.     Conjunctiva/sclera: Conjunctivae normal.     Pupils: Pupils are equal, round, and reactive to light.  Neck:     Trachea: Trachea normal.  Cardiovascular:     Rate and Rhythm: Normal rate.     Pulses: Normal pulses.     Heart sounds: No murmur heard. Pulmonary:     Effort: Pulmonary effort is normal. No respiratory distress.  Abdominal:     Palpations: Abdomen is soft.     Tenderness: There is no abdominal tenderness.  Musculoskeletal:        General: No swelling.     Cervical back: Normal range of motion and neck supple.  Skin:    General: Skin is warm and dry.     Capillary Refill: Capillary refill takes less than 2 seconds.  Neurological:     General: No focal deficit present.     Mental Status: She is alert and oriented to person, place, and time.     GCS: GCS eye subscore is 4. GCS verbal subscore is 5. GCS motor subscore is 6.     Cranial Nerves: Facial asymmetry present.  Comments: Left sided facial droop, speech normal, MAEW x 4 strength intact, pulses intact  House Brackmann III  Psychiatric:        Attention and Perception: Attention normal.        Mood and Affect: Mood normal.         Speech: Speech normal.        Behavior: Behavior normal. Behavior is cooperative.        Thought Content: Thought content normal.      UC Treatments / Results  Labs (all labs ordered are listed, but only abnormal results are displayed) Labs Reviewed - No data to display  EKG   Radiology No results found.  Procedures Procedures (including critical care time)  Medications Ordered in UC Medications - No data to display  Initial Impression / Assessment and Plan / UC Course  I have reviewed the triage vital signs and the nursing notes.  Pertinent labs & imaging results that were available during my care of the patient were reviewed by me and considered in my medical decision making (see chart for details).  Clinical Course as of 12/14/22 1928  Thu Dec 14, 2022  1803 Mellody Dance III [JD]    Clinical Course User Index [JD] Clancy Gourd, NP   Discussed exam findings and plan of care with pt, to keep an eye on blood sugar and symptoms as prednisone will make blood sugar go up. Also pt aware if she develops worsening symptoms or rash will need to go to Er for further evaluation,will need antiviral most likely and  further testing.  Verbalized understanding to this provider. Ddx: Bell's palsy,TIA,shingles Final Clinical Impressions(s) / UC Diagnoses   Final diagnoses:  Facial paralysis/Bells palsy     Discharge Instructions      Please take prednisone as directed. May make you moody, irritable,insomnia, increased thirst,hunger, heart beating fast,etc.   If you develop new or worsening symptoms(rash,pain,headache, loss of function,loss of speech,etc) go immediately to ER or call 9-1-1 for further evaluation  May use over the counter Systane or Refresh eye drops as needed.     ED Prescriptions     Medication Sig Dispense Auth. Provider   predniSONE (DELTASONE) 20 MG tablet Take 3 tablets (60 mg total) by mouth daily with breakfast for 7 days. 21 tablet  [place] Volante, Para March, NP      PDMP not reviewed this encounter.   Clancy Gourd, NP 12/14/22 1930

## 2022-12-14 NOTE — Telephone Encounter (Signed)
Called patient she will be going to urgent care

## 2022-12-14 NOTE — ED Triage Notes (Signed)
Patient to Urgent Care with complaints of left sided facial drooping.   Drooping present to left eye. Reports significant watering and being unable to completely close her eye. Also with inability to completely lift left eyebrow. Symptoms started on Monday.  Patient also with asymmetrical smile- drooping to the left corner of her mouth that started on Tuesday.  Reports some discomfort/ headache present to left head. Taking motrin.   No extremity weakness/ balance issues/ denies vision issues. Denies trauma.

## 2022-12-14 NOTE — Telephone Encounter (Signed)
Reason for Disposition  Bell's palsy suspected (i.e., weakness on only one side of the face, developing over hours to days, no other symptoms)  Answer Assessment - Initial Assessment Questions 1. SYMPTOM: "What is the main symptom you are concerned about?" (e.g., weakness, numbness)     Facial palsy- left 2. ONSET: "When did this start?" (minutes, hours, days; while sleeping)     Monday- watery eye- Tuesday- twinge, Wednesday- tignle mouth and lips 3. LAST NORMAL: "When was the last time you (the patient) were normal (no symptoms)?"     Sunday evening/Monday morning 4. PATTERN "Does this come and go, or has it been constant since it started?"  "Is it present now?"     constant 5. CARDIAC SYMPTOMS: "Have you had any of the following symptoms: chest pain, difficulty breathing, palpitations?"     no 6. NEUROLOGIC SYMPTOMS: "Have you had any of the following symptoms: headache, dizziness, vision loss, double vision, changes in speech, unsteady on your feet?"     no 7. OTHER SYMPTOMS: "Do you have any other symptoms?"     no  Protocols used: Neurologic Deficit-A-AH

## 2022-12-14 NOTE — Discharge Instructions (Addendum)
Please take prednisone as directed. May make you moody, irritable,insomnia, increased thirst,hunger, heart beating fast,etc.   If you develop new or worsening symptoms(rash,pain,headache, loss of function,loss of speech,etc) go immediately to ER or call 9-1-1 for further evaluation  May use over the counter Systane or Refresh eye drops as needed.

## 2022-12-14 NOTE — Telephone Encounter (Signed)
  Chief Complaint: facial palsy -left Symptoms: left facial drooping, eye drooping Frequency: symptoms started Monday- have gotten worse overlast few days Pertinent Negatives: Patient denies headache, dizziness, vision loss, double vision, changes in speech, unsteady on your feet Disposition: [] ED /[] Urgent Care (no appt availability in office) / [] Appointment(In office/virtual)/ []  [place] [facility]/ [] Home Care/ [x] Refused Recommended Disposition /[] [place] [facility]/ []  Follow-up with PCP Additional Notes: Patient refuses ED disposition- call to office- per Cassandra- no open appointment- ED/UC advised- patient states she may go after work but not now.

## 2022-12-18 ENCOUNTER — Ambulatory Visit: Payer: BC Managed Care – PPO | Admitting: Internal Medicine

## 2022-12-18 ENCOUNTER — Encounter: Payer: Self-pay | Admitting: Internal Medicine

## 2022-12-18 ENCOUNTER — Ambulatory Visit: Payer: Self-pay

## 2022-12-18 VITALS — BP 120/68 | HR 102 | Temp 97.6°F | Resp 18 | Ht 67.0 in | Wt 279.0 lb

## 2022-12-18 DIAGNOSIS — K047 Periapical abscess without sinus: Secondary | ICD-10-CM

## 2022-12-18 DIAGNOSIS — G510 Bell's palsy: Secondary | ICD-10-CM | POA: Diagnosis not present

## 2022-12-18 MED ORDER — AMOXICILLIN-POT CLAVULANATE 875-125 MG PO TABS
1.0000 | ORAL_TABLET | Freq: Two times a day (BID) | ORAL | 0 refills | Status: AC
Start: 2022-12-18 — End: 2022-12-23

## 2022-12-18 MED ORDER — VALACYCLOVIR HCL 1 G PO TABS
1000.0000 mg | ORAL_TABLET | Freq: Two times a day (BID) | ORAL | 0 refills | Status: AC
Start: 2022-12-18 — End: 2022-12-25

## 2022-12-18 NOTE — Telephone Encounter (Signed)
Chief Complaint: Mouth pain Symptoms: inflammation, sore to touch  Frequency: constant discomfort Pertinent Negatives: Patient denies nausea, vomiting fever, chills Disposition: [] ED /[] Urgent Care (no appt availability in office) / [x] Appointment(In office/virtual)/ []  [place] [facility]/ [] Home Care/ [] Refused Recommended Disposition /[] [place] [facility]/ []  Follow-up with PCP Additional Notes: Patient states she was dx with Bells palsy last week and has developed mouth pain today. Patient reports sore to touch and is considered she may have an infection in the mouth. Advised patient that she will need to be evaluated. Patient is agreeable and has been scheduled today at 1340 with provider. Reason for Disposition  Weak immune system (e.g., HIV positive, cancer chemo, splenectomy, organ transplant, chronic steroids)  Answer Assessment - Initial Assessment Questions 1. SYMPTOM: "What's the main symptom you're concerned about?" (e.g., chapped lips, dry mouth, lump, sores)     Inflammation 2. ONSET: "When did the  swelling  start?"     This morning 3. PAIN: "Is there any pain?" If Yes, ask: "How bad is it?" (Scale: 1-10; mild, moderate, severe)   - MILD (1-3):  doesn't interfere with eating or normal activities   - MODERATE (4-7): interferes with eating some solids and normal activities   - SEVERE (8-10):  excruciating pain, interferes with most normal activities   - SEVERE DYSPHAGIA: can't swallow liquids, drooling     moderate 4. CAUSE: "What do you think is causing the symptoms?"     I think it is an abscess  5. OTHER SYMPTOMS: "Do you have any other symptoms?" (e.g., fever, sore throat, toothache, swelling)     Sore to touch, swelling,  Protocols used: Mouth Symptoms-A-AH

## 2022-12-18 NOTE — Patient Instructions (Addendum)
It was great seeing you today!  Plan discussed at today's visit: -Continue steroids -Start antibiotics twice a day for 5 days -Start antiviral therapy twice a day for 7 days  -Continue eye drops  Follow up in: already scheduled  Take care and let us know if you have any questions or concerns prior to your next visit.  Dr. Caralee Ates  Bell's Palsy, Adult  Bell's palsy is a short-term inability to move muscles in a part of the face. The inability to move, also called paralysis, results from inflammation or compression of the seventh cranial nerve. This nerve travels along the skull and under the ear to the side of the face. This nerve is responsible for facial movements that include blinking, closing the eyes, smiling, and frowning. What are the causes? The exact cause of this condition is not known. It may be caused by an infection from a virus, such as the chickenpox (herpes zoster), Epstein-Barr, or mumps virus. What increases the risk? You are more likely to develop this condition if: You are pregnant. You have diabetes. You have had a recent infection in your nose, throat, or airways. You have a weakened body defense system (immune system). You have had a facial injury, such as a fracture. You have a family history of Bell's palsy. What are the signs or symptoms? Symptoms of this condition include: Weakness on one side of the face. Drooping eyelid and corner of the mouth. Excessive tearing in one eye. Difficulty closing the eyelid. Dry eye. Drooling. Dry mouth. Changes in taste. Change in facial appearance. Pain behind one ear. Ringing in one or both ears. Sensitivity to sound in one ear. Facial twitching. Headache. Impaired speech. Dizziness. Difficulty eating or drinking. Most of the time, only one side of the face is affected. In rare cases, Bell's palsy may affect the whole face. How is this diagnosed? This condition is diagnosed based on: Your symptoms. Your  medical history. A physical exam. You may also have to see health care providers who specialize in disorders of the nerves (neurologist) or diseases and conditions of the eye (ophthalmologist). You may have tests, such as: A test to check for nerve damage (electromyogram). Imaging studies, such as a CT scan or an MRI. Blood tests. How is this treated? This condition affects every person differently. Sometimes symptoms go away without treatment within a couple weeks. If treatment is needed, it varies from person to person. The goal of treatment is to reduce inflammation and protect the eye from damage. Treatment for Bell's palsy may include: Medicines, such as: Steroids to reduce swelling and inflammation. Antiviral medicines. Pain relievers, including aspirin, acetaminophen, or ibuprofen. Eye drops or ointment to keep your eye moist. Eye protection, if you cannot close your eye. Exercises or massage to regain muscle strength and function (physical therapy). Follow these instructions at home:  Take over-the-counter and prescription medicines only as told by your health care provider. If your eye is affected: Keep your eye moist with eye drops or ointment as told by your health care provider. Follow instructions for eye care and protection as told by your health care provider. Do any physical therapy exercises as told by your health care provider. Keep all follow-up visits. This is important. Contact a health care provider if: You have a fever or chills. Your symptoms do not get better within 2-3 weeks, or your symptoms get worse. Your eye is red, irritated, or painful. You have new symptoms. Get help right away if: You have  weakness or numbness in a part of your body other than your face. You have trouble swallowing. You develop neck pain or stiffness. You develop dizziness or shortness of breath. Summary Bell's palsy is a short-term inability to move muscles in a part of the face.  The inability to move results from inflammation or compression of the facial nerve. This condition affects every person differently. Sometimes symptoms go away without treatment within a couple weeks. If treatment is needed, it varies from person to person. The goal of treatment is to reduce inflammation and protect the eye from damage. Contact your health care provider if your symptoms do not get better within 2-3 weeks, or your symptoms get worse. This information is not intended to replace advice given to you by your health care provider. Make sure you discuss any questions you have with your health care provider. Document Revised: 01/29/2020 Document Reviewed: 01/29/2020 Elsevier Patient Education  2024 [employer].

## 2022-12-18 NOTE — Progress Notes (Signed)
Acute Office Visit  Subjective:     Patient ID: Bethany Nash, female    DOB: 1960-08-06, 62 y.o.   MRN: [id number]  Chief Complaint  Patient presents with   bells palsy    Micah Flesher to urgent care thursday   tooth abscess    HPI Patient is in today for left sided facial droop/weakness.  Patient states about a week ago she noticed a watering tearful eye on the left, no pain.  Next day she woke up with left-sided facial droop and numbness around the lips.  She then went to urgent care who diagnosed her with Bell's palsy and treated with her with prednisone 60 mg x 7 days.  She is now on the fifth day of that treatment.  She has noticed some improvement in her symptoms initially but now feels like it could be getting worse.  She does have a spot on her left upper gum that is inflamed and has a history of being infected.  She is worried that is now infected at it is painful and red.  No fevers.  She is using lubricating drops in her left eye.  Review of Systems  Constitutional:  Negative for chills and fever.  Eyes:  Positive for discharge. Negative for blurred vision and redness.  Respiratory:  Negative for shortness of breath.   Cardiovascular:  Negative for chest pain.  Neurological:  Positive for sensory change. Negative for weakness.        Objective:    BP 120/68   Pulse (!) 102   Temp 97.6 F (36.4 C)   Resp 18   Ht 5\' 7"  (1.702 m)   Wt 279 lb (126.6 kg)   SpO2 94%   BMI 43.70 kg/m  BP Readings from Last 3 Encounters:  12/18/22 120/68  12/14/22 105/69  10/04/22 122/62   Wt Readings from Last 3 Encounters:  12/18/22 279 lb (126.6 kg)  10/04/22 268 lb 14.4 oz (122 kg)  07/04/22 263 lb 11.2 oz (119.6 kg)      Physical Exam Constitutional:      Appearance: Normal appearance.  HENT:     Head: Normocephalic and atraumatic.     Mouth/Throat:     Mouth: Mucous membranes are moist.     Pharynx: Oropharynx is clear.     Comments: What appears to be a small inflamed  abscess on the upper side of her left gum Eyes:     Extraocular Movements: Extraocular movements intact.     Conjunctiva/sclera: Conjunctivae normal.     Pupils: Pupils are equal, round, and reactive to light.  Cardiovascular:     Rate and Rhythm: Normal rate and regular rhythm.  Pulmonary:     Effort: Pulmonary effort is normal.     Breath sounds: Normal breath sounds.  Neurological:     Mental Status: She is alert.     Cranial Nerves: Facial asymmetry present.     Sensory: Sensory deficit present.     Motor: No weakness.     Comments: Left-sided facial droop affecting forehead with decree sensation around the mouth  Psychiatric:        Mood and Affect: Mood normal.        Behavior: Behavior normal.     No results found for any visits on 12/18/22.      Assessment & Plan:   1. Bell's palsy: Exam consistent with Bell's palsy, symptoms moderate to severe with mild improvement with prednisone.  Will add valacyclovir twice daily for  7 days.  Continue lubricating eyedrops.  Information on Bell's palsy printed and provided to the patient.  - valACYclovir (VALTREX) 1000 MG tablet; Take 1 tablet (1,000 mg total) by mouth 2 (two) times daily for 7 days.  Dispense: 14 tablet; Refill: 0  2. Dental infection: Appears to be a small abscess, will treat with Augmentin and follow-up with her dentist.  - amoxicillin-clavulanate (AUGMENTIN) 875-125 MG tablet; Take 1 tablet by mouth 2 (two) times daily for 5 days.  Dispense: 10 tablet; Refill: 0  Return for already scheduled.  Margarita Mail, DO

## 2023-01-07 NOTE — Progress Notes (Signed)
Established Patient Office Visit  Subjective    Patient ID: Bethany Nash, female    DOB: [birth year]  Age: 62 y.o. MRN: [id number]  CC:  Chief Complaint  Patient presents with   Follow-up    HPI Bethany Nash presents to follow up on diabetes. Since our LOV the patient was diagnosed and seen for Bell's Palsy. Today she states all symptoms have resolved.   Diabetes, Type 2: -Last A1c 6.5% 5/24 -Medications: Metformin 500 mg BID XR -Diet: working on cutting out sugar and carbs but diet was a little bit worse lately and also had steroids a few weeks ago after being treated for Bell's Palsy -Exercise: None currently -Eye exam: UTD 1/24 -Foot exam: UTD 12/23 -Microalbumin: UTD 2/24 -PNA vaccine: UTD -Denies symptoms of hypoglycemia, polyuria, polydipsia, numbness extremities, foot ulcers/trauma.   HLD: -Medications: Nothing  -Last lipid panel: Lipid Panel     Component Value Date/Time   CHOL 258 (H) 03/27/2022 1031   CHOL 232 (H) 12/25/2019 1011   TRIG 370 (H) 03/27/2022 1031   HDL 38 (L) 03/27/2022 1031   HDL 36 (L) 12/25/2019 1011   CHOLHDL 6.8 (H) 03/27/2022 1031   LDLCALC 157 (H) 03/27/2022 1031   LABVLDL 43 (H) 12/25/2019 1011   The 10-year ASCVD risk score (Arnett DK, et al., 2019) is: 10.9%   Values used to calculate the score:     Age: 48 years     Sex: Female     Is Non-Hispanic African American: No     Diabetic: Yes     Tobacco smoker: No     Systolic Blood Pressure: 126 mmHg     Is BP treated: No     HDL Cholesterol: 38 mg/dL     Total Cholesterol: 258 mg/dL    Outpatient Encounter Medications as of 01/08/2023  Medication Sig   acidophilus (RISAQUAD) CAPS capsule Take by mouth daily.   Biotin 1 MG CAPS Take by mouth.   metFORMIN (GLUCOPHAGE-XR) 500 MG 24 hr tablet TAKE 1 TABLET BY MOUTH TWICE A DAY WITH FOOD   Multiple Vitamins-Minerals (BODY/HAIR/SKIN/NAILS PO) Take 1 tablet by mouth daily.   No facility-administered encounter medications on file as of  01/08/2023.    Past Medical History:  Diagnosis Date   Obesity    Type 2 diabetes mellitus with hyperglycemia, without long-term current use of insulin (HCC) 04/03/2022    Past Surgical History:  Procedure Laterality Date   ABDOMINAL HYSTERECTOMY  1992   still has ovaries   ANKLE ARTHROSCOPY WITH FUSION  2007   CESAREAN SECTION     CHOLECYSTECTOMY  2008   COLONOSCOPY WITH PROPOFOL N/A 01/27/2019   Procedure: COLONOSCOPY WITH PROPOFOL;  Surgeon: Toney Reil, MD;  [facility] END[facility]COPY;  Service: Gastroenterology;  Laterality: N/A;   FRACTURE SURGERY  1967, 1970, 2006   TUBAL LIGATION  1989    Family History  Problem Relation Age of Onset   Cancer Mother 70       unsure where it started   Heart disease Father    Alcoholism Father    Stomach cancer Brother    Cancer Brother    Breast cancer Neg Hx    Ovarian cancer Neg Hx    Colon cancer Neg Hx     Social History   Socioeconomic History   Marital status: Divorced    Spouse name: Not on file   Number of children: Not on file   Years of education: Not on file  Highest education level: Associate degree: occupational, Scientist, product/process development, or vocational program  Occupational History   Not on file  Tobacco Use   Smoking status: Never   Smokeless tobacco: Never   Tobacco comments:    Never smoked  Vaping Use   Vaping status: Never Used  Substance and Sexual Activity   Alcohol use: Yes    Alcohol/week: 1.0 standard drink of alcohol    Types: 1 Standard drinks or equivalent per week    Comment: Less than 1 per week on average   Drug use: Never   Sexual activity: Not Currently    Birth control/protection: Surgical  Other Topics Concern   Not on file  Social History Narrative   Not on file   Social Determinants of Health   Financial Resource Strain: Low Risk  (10/02/2022)   Overall Financial Resource Strain (CARDIA)    Difficulty of Paying Living Expenses: Not very hard  Food Insecurity: No Food Insecurity  (10/02/2022)   Hunger Vital Sign    Worried About Running Out of Food in the Last Year: Never true    Ran Out of Food in the Last Year: Never true  Transportation Needs: No Transportation Needs (10/02/2022)   PRAPARE - Administrator, Civil Service (Medical): No    Lack of Transportation (Non-Medical): No  Physical Activity: Insufficiently Active (10/02/2022)   Exercise Vital Sign    Days of Exercise per Week: 3 days    Minutes of Exercise per Session: 20 min  Stress: No Stress Concern Present (10/02/2022)   [employer] of Occupational Health - Occupational Stress Questionnaire    Feeling of Stress : Not at all  Social Connections: Socially Isolated (10/02/2022)   Social Connection and Isolation Panel [NHANES]    Frequency of Communication with Friends and Family: More than three times a week    Frequency of Social Gatherings with Friends and Family: More than three times a week    Attends Religious Services: Never    Database administrator or Organizations: No    Attends Engineer, structural: Not on file    Marital Status: Divorced  Intimate Partner Violence: Not on file    Review of Systems  Constitutional:  Negative for chills and fever.  Eyes:  Negative for blurred vision.  Respiratory:  Negative for shortness of breath.   Cardiovascular:  Negative for chest pain.  Gastrointestinal:  Negative for abdominal pain, diarrhea, nausea and vomiting.      Objective    BP 126/72   Pulse (!) 107   Temp 97.8 F (36.6 C)   Ht 5\' 7"  (1.702 m)   Wt 278 lb (126.1 kg)   SpO2 94%   BMI 43.54 kg/m   Physical Exam Constitutional:      Appearance: Normal appearance.  HENT:     Head: Normocephalic and atraumatic.  Eyes:     Conjunctiva/sclera: Conjunctivae normal.  Cardiovascular:     Rate and Rhythm: Normal rate and regular rhythm.  Pulmonary:     Effort: Pulmonary effort is normal.     Breath sounds: Normal breath sounds.  Skin:    General: Skin is  warm and dry.  Neurological:     General: No focal deficit present.     Mental Status: She is alert. Mental status is at baseline.  Psychiatric:        Mood and Affect: Mood normal.        Behavior: Behavior normal.  Assessment & Plan:   1. Type 2 diabetes mellitus with hyperglycemia, without long-term current use of insulin (HCC): A1c stable at 6.5% today, continue Metformin 500 mg XR BID. Physical in November, then can repeat in A1c within 6 months.   - POCT HgB A1C  2. Need for influenza vaccination: Flu vaccine today.   - Flu vaccine trivalent PF, 6mos and older(Flulaval,Afluria,Fluarix,Fluzone)   Return for already scheduled.   Margarita Mail, DO

## 2023-01-08 ENCOUNTER — Encounter: Payer: Self-pay | Admitting: Internal Medicine

## 2023-01-08 ENCOUNTER — Ambulatory Visit: Payer: BC Managed Care – PPO | Admitting: Internal Medicine

## 2023-01-08 VITALS — BP 126/72 | HR 107 | Temp 97.8°F | Ht 67.0 in | Wt 278.0 lb

## 2023-01-08 DIAGNOSIS — Z23 Encounter for immunization: Secondary | ICD-10-CM | POA: Diagnosis not present

## 2023-01-08 DIAGNOSIS — E1165 Type 2 diabetes mellitus with hyperglycemia: Secondary | ICD-10-CM | POA: Diagnosis not present

## 2023-01-08 DIAGNOSIS — Z7984 Long term (current) use of oral hypoglycemic drugs: Secondary | ICD-10-CM

## 2023-01-08 LAB — POCT GLYCOSYLATED HEMOGLOBIN (HGB A1C): Hemoglobin A1C: 6.5 % — AB (ref 4.0–5.6)

## 2023-03-26 NOTE — Progress Notes (Unsigned)
Name: Bethany Nash   MRN: [id number]    DOB: June 27, 1960   Date:03/29/2023       Progress Note  Subjective  Chief Complaint  Chief Complaint  Patient presents with   Annual Exam    W/ pap    HPI  Patient presents for annual CPE.  Diet: well rounded, cheating more, trying to decrease fat Exercise: no regular exercise - trying to walk   Last Eye Exam: UTD Last Dental Exam: UTD  Flowsheet Row Office Visit from 10/04/2022 in [facility]  AUDIT-C Score 1      Depression: Phq 9 is  negative    03/29/2023    8:59 AM 01/08/2023    9:00 AM 12/18/2022    1:43 PM 10/04/2022    8:46 AM 07/04/2022    8:15 AM  Depression screen PHQ 2/9  Decreased Interest 0 0 0 0 0  Down, Depressed, Hopeless 0 0 0 0 0  PHQ - 2 Score 0 0 0 0 0  Altered sleeping 0 0 0 0 0  Tired, decreased energy 0 0 0 0 0  Change in appetite 0 0 0 0 0  Feeling bad or failure about yourself  0 0 0 0 0  Trouble concentrating 0 0 0 0 0  Moving slowly or fidgety/restless 0 0 0 0 0  Suicidal thoughts 0 0 0 0 0  PHQ-9 Score 0 0 0 0 0  Difficult doing work/chores Not difficult at all Not difficult at all Not difficult at all Not difficult at all Not difficult at all   Hypertension: BP Readings from Last 3 Encounters:  03/29/23 132/86  01/08/23 126/72  12/18/22 120/68   Obesity: Wt Readings from Last 3 Encounters:  03/29/23 285 lb (129.3 kg)  01/08/23 278 lb (126.1 kg)  12/18/22 279 lb (126.6 kg)   BMI Readings from Last 3 Encounters:  03/29/23 44.64 kg/m  01/08/23 43.54 kg/m  12/18/22 43.70 kg/m     Vaccines:   HPV: aged out    Tdap: 02/27/20 Shingrix: complete Pneumonia: UTD Flu: complete COVID-19:complete   Hep C Screening: Complete STD testing and prevention (HIV/chl/gon/syphilis): HIV complete Intimate partner violence: negative screen  LMP: Hysterectomy at age 67 - due to irregular and heavy bleeding Discussed importance of follow up if any post-menopausal bleeding:  yes  Incontinence Symptoms: negative for symptoms   Breast cancer:  - Last Mammogram: 06/09/22  Osteoporosis Prevention : Discussed high calcium and vitamin D supplementation, weight bearing exercises Bone density :not applicable   Cervical cancer screening: 12/23/18, due today  Skin cancer: Discussed monitoring for atypical lesions  Colorectal cancer:01/27/19 colonoscopy, repeat in 7 years Lung cancer:  Low Dose CT Chest recommended if Age 1-80 years, 20 pack-year currently smoking OR have quit w/in 15years. Patient does not qualify for screen     Advanced Care Planning: A voluntary discussion about advance care planning including the explanation and discussion of advance directives.  Discussed health care proxy and Living will, and the patient was able to identify a health care proxy as daughter Merita Norton.  Patient does not have a living will and power of attorney of health care   Lipids: Lab Results  Component Value Date   CHOL 258 (H) 03/27/2022   CHOL 232 (H) 12/25/2019   CHOL 217 (H) 12/23/2018   Lab Results  Component Value Date   HDL 38 (L) 03/27/2022   HDL 36 (L) 12/25/2019   HDL 34 (L) 12/23/2018  Lab Results  Component Value Date   LDLCALC 157 (H) 03/27/2022   LDLCALC 153 (H) 12/25/2019   LDLCALC 149 (H) 12/23/2018   Lab Results  Component Value Date   TRIG 370 (H) 03/27/2022   TRIG 234 (H) 12/25/2019   TRIG 168 (H) 12/23/2018   Lab Results  Component Value Date   CHOLHDL 6.8 (H) 03/27/2022   CHOLHDL 6.4 (H) 12/25/2019   CHOLHDL 6.4 (H) 12/23/2018   No results found for: "LDLDIRECT"  Glucose: Glucose  Date Value Ref Range Status  12/25/2019 102 (H) 65 - 99 mg/dL Final  13/12/6576 469 (H) 65 - 99 mg/dL Final   Glucose, Bld  Date Value Ref Range Status  03/27/2022 416 (H) 65 - 99 mg/dL Final    Comment:    Verified by repeat analysis. Marland Kitchen .            Fasting reference interval . For someone without known diabetes, a glucose value >125 mg/dL  indicates that they may have diabetes and this should be confirmed with a follow-up test. .     Patient Active Problem List   Diagnosis Date Noted   Facial paralysis/Bells palsy 12/14/2022   Type 2 diabetes mellitus with hyperglycemia, without long-term current use of insulin (HCC) 04/03/2022   Encounter for screening colonoscopy    Prediabetes 12/28/2018   High cholesterol 12/28/2018   Low HDL (under 40) 12/28/2018   High triglycerides 12/28/2018   Morbid obesity (HCC) 12/28/2018   Family history of cancer in mother 12/28/2018    Past Surgical History:  Procedure Laterality Date   ABDOMINAL HYSTERECTOMY  1992   still has ovaries   ANKLE ARTHROSCOPY WITH FUSION  2007   CESAREAN SECTION     CHOLECYSTECTOMY  2008   COLONOSCOPY WITH PROPOFOL N/A 01/27/2019   Procedure: COLONOSCOPY WITH PROPOFOL;  Surgeon: Toney Reil, MD;  Location: [facility] ENDOSCOPY;  Service: Gastroenterology;  Laterality: N/A;   FRACTURE SURGERY  1967, 1970, 2006   TUBAL LIGATION  1989    Family History  Problem Relation Age of Onset   Cancer Mother 51       unsure where it started   Heart disease Father    Alcoholism Father    Stomach cancer Brother    Cancer Brother    Breast cancer Neg Hx    Ovarian cancer Neg Hx    Colon cancer Neg Hx     Social History   Socioeconomic History   Marital status: Divorced    Spouse name: Not on file   Number of children: Not on file   Years of education: Not on file   Highest education level: Associate degree: occupational, Scientist, product/process development, or vocational program  Occupational History   Not on file  Tobacco Use   Smoking status: Never   Smokeless tobacco: Never   Tobacco comments:    Never smoked  Vaping Use   Vaping status: Never Used  Substance and Sexual Activity   Alcohol use: Yes    Alcohol/week: 1.0 standard drink of alcohol    Types: 1 Standard drinks or equivalent per week    Comment: Less than 1 per week on average   Drug use: Never    Sexual activity: Not Currently    Birth control/protection: Surgical  Other Topics Concern   Not on file  Social History Narrative   Not on file   Social Determinants of Health   Financial Resource Strain: Low Risk  (10/02/2022)   Overall Financial Resource  Strain (CARDIA)    Difficulty of Paying Living Expenses: Not very hard  Food Insecurity: No Food Insecurity (10/02/2022)   Hunger Vital Sign    Worried About Running Out of Food in the Last Year: Never true    Ran Out of Food in the Last Year: Never true  Transportation Needs: No Transportation Needs (10/02/2022)   PRAPARE - Administrator, Civil Service (Medical): No    Lack of Transportation (Non-Medical): No  Physical Activity: Insufficiently Active (10/02/2022)   Exercise Vital Sign    Days of Exercise per Week: 3 days    Minutes of Exercise per Session: 20 min  Stress: No Stress Concern Present (10/02/2022)   [employer] of Occupational Health - Occupational Stress Questionnaire    Feeling of Stress : Not at all  Social Connections: Socially Isolated (10/02/2022)   Social Connection and Isolation Panel [NHANES]    Frequency of Communication with Friends and Family: More than three times a week    Frequency of Social Gatherings with Friends and Family: More than three times a week    Attends Religious Services: Never    Database administrator or Organizations: No    Attends Engineer, structural: Not on file    Marital Status: Divorced  Intimate Partner Violence: Not on file     Current Outpatient Medications:    acidophilus (RISAQUAD) CAPS capsule, Take by mouth daily., Disp: , Rfl:    Biotin 1 MG CAPS, Take by mouth., Disp: , Rfl:    metFORMIN (GLUCOPHAGE-XR) 500 MG 24 hr tablet, TAKE 1 TABLET BY MOUTH TWICE A DAY WITH FOOD, Disp: 180 tablet, Rfl: 1   Multiple Vitamins-Minerals (BODY/HAIR/SKIN/NAILS PO), Take 1 tablet by mouth daily., Disp: , Rfl:   No Known Allergies   Review of Systems   All other systems reviewed and are negative.    Objective  Vitals:   03/29/23 0856  BP: 132/86  Pulse: 93  Resp: 18  Temp: 97.8 F (36.6 C)  SpO2: 96%  Weight: 285 lb (129.3 kg)  Height: 5\' 7"  (1.702 m)    Body mass index is 44.64 kg/m.  Physical Exam Exam conducted with a chaperone present.  Constitutional:      Appearance: Normal appearance.  HENT:     Head: Normocephalic and atraumatic.  Eyes:     Conjunctiva/sclera: Conjunctivae normal.  Cardiovascular:     Rate and Rhythm: Normal rate and regular rhythm.  Pulmonary:     Effort: Pulmonary effort is normal.     Breath sounds: Normal breath sounds.  Chest:  Breasts:    Right: Normal.     Left: Normal.  Genitourinary:    Comments: External genitalia within normal limits.  Vaginal mucosa pink, moist, normal rugae.  Nonfriable cervix without lesions, no discharge or bleeding noted on speculum exam.    Lymphadenopathy:     Upper Body:     Right upper body: No supraclavicular, axillary or pectoral adenopathy.     Left upper body: No supraclavicular, axillary or pectoral adenopathy.  Skin:    General: Skin is warm and dry.  Neurological:     General: No focal deficit present.     Mental Status: She is alert. Mental status is at baseline.  Psychiatric:        Mood and Affect: Mood normal.        Behavior: Behavior normal.     Recent Results (from the past 2160 hour(s))  POCT HgB A1C  Status: Abnormal   Collection Time: 01/08/23  9:17 AM  Result Value Ref Range   Hemoglobin A1C 6.5 (A) 4.0 - 5.6 %   HbA1c POC (<> result, manual entry)     HbA1c, POC (prediabetic range)     HbA1c, POC (controlled diabetic range)       Fall Risk:    03/29/2023    8:56 AM 01/08/2023    9:00 AM 12/18/2022    1:43 PM 10/04/2022    8:43 AM 07/04/2022    8:14 AM  Fall Risk   Falls in the past year? 0 0 0 0 0  Number falls in past yr: 0 0 0 0 0  Injury with Fall? 0 0 0 0 0  Risk for fall due to :     No Fall Risks   Follow up     Falls prevention discussed;Education provided;Falls evaluation completed    Functional Status Survey: Is the patient deaf or have difficulty hearing?: No Does the patient have difficulty seeing, even when wearing glasses/contacts?: No Does the patient have difficulty concentrating, remembering, or making decisions?: No Does the patient have difficulty walking or climbing stairs?: No Does the patient have difficulty dressing or bathing?: No Does the patient have difficulty doing errands alone such as visiting a doctor's office or shopping?: No   Assessment & Plan  1. Annual physical exam: Physical exam completed, health maintenance reviewed and annual labs ordered.   - CBC w/Diff/Platelet - COMPLETE METABOLIC PANEL WITH GFR - Lipid Profile  2. Screening for cervical cancer: Pap today.   - Cytology - PAP   -USPSTF grade A and B recommendations reviewed with patient; age-appropriate recommendations, preventive care, screening tests, etc discussed and encouraged; healthy living encouraged; see AVS for patient education given to patient -Discussed importance of 150 minutes of physical activity weekly, eat two servings of fish weekly, eat one serving of tree nuts ( cashews, pistachios, pecans, almonds.Marland Kitchen) every other day, eat 6 servings of fruit/vegetables daily and drink plenty of water and avoid sweet beverages.   -Reviewed Health Maintenance: Yes.

## 2023-03-29 ENCOUNTER — Ambulatory Visit (INDEPENDENT_AMBULATORY_CARE_PROVIDER_SITE_OTHER): Payer: BC Managed Care – PPO | Admitting: Internal Medicine

## 2023-03-29 ENCOUNTER — Other Ambulatory Visit (HOSPITAL_COMMUNITY)
Admission: RE | Admit: 2023-03-29 | Discharge: 2023-03-29 | Disposition: A | Discharge status: Home / Self Care | Payer: BC Managed Care – PPO | Source: Ambulatory Visit | Attending: Internal Medicine | Admitting: Internal Medicine

## 2023-03-29 ENCOUNTER — Encounter: Payer: Self-pay | Admitting: Internal Medicine

## 2023-03-29 VITALS — BP 132/86 | HR 93 | Temp 97.8°F | Resp 18 | Ht 67.0 in | Wt 285.0 lb

## 2023-03-29 DIAGNOSIS — Z124 Encounter for screening for malignant neoplasm of cervix: Secondary | ICD-10-CM | POA: Insufficient documentation

## 2023-03-29 DIAGNOSIS — Z0000 Encounter for general adult medical examination without abnormal findings: Secondary | ICD-10-CM | POA: Diagnosis not present

## 2023-03-29 DIAGNOSIS — E781 Pure hyperglyceridemia: Secondary | ICD-10-CM | POA: Diagnosis not present

## 2023-03-30 LAB — COMPLETE METABOLIC PANEL WITH GFR
AG Ratio: 1.6 (calc) (ref 1.0–2.5)
ALT: 16 U/L (ref 6–29)
AST: 15 U/L (ref 10–35)
Albumin: 4.2 g/dL (ref 3.6–5.1)
Alkaline phosphatase (APISO): 64 U/L (ref 37–153)
BUN: 13 mg/dL (ref 7–25)
CO2: 29 mmol/L (ref 20–32)
Calcium: 9.9 mg/dL (ref 8.6–10.4)
Chloride: 101 mmol/L (ref 98–110)
Creat: 0.76 mg/dL (ref 0.50–1.05)
Globulin: 2.7 g/dL (ref 1.9–3.7)
Glucose, Bld: 125 mg/dL — ABNORMAL HIGH (ref 65–99)
Potassium: 4.8 mmol/L (ref 3.5–5.3)
Sodium: 137 mmol/L (ref 135–146)
Total Bilirubin: 0.4 mg/dL (ref 0.2–1.2)
Total Protein: 6.9 g/dL (ref 6.1–8.1)
eGFR: 89 mL/min/{1.73_m2} (ref >=60)

## 2023-03-30 LAB — CBC WITH DIFFERENTIAL/PLATELET
Absolute Lymphocytes: 2471 {cells}/uL (ref 850–3900)
Absolute Monocytes: 618 {cells}/uL (ref 200–950)
Basophils Absolute: 50 {cells}/uL (ref 0–200)
Basophils Relative: 0.7 %
Eosinophils Absolute: 99 {cells}/uL (ref 15–500)
Eosinophils Relative: 1.4 %
HCT: 41.8 % (ref 35.0–45.0)
Hemoglobin: 13.7 g/dL (ref 11.7–15.5)
MCH: 30.1 pg (ref 27.0–33.0)
MCHC: 32.8 g/dL (ref 32.0–36.0)
MCV: 91.9 fL (ref 80.0–100.0)
MPV: 11.3 fL (ref 7.5–12.5)
Monocytes Relative: 8.7 %
Neutro Abs: 3862 {cells}/uL (ref 1500–7800)
Neutrophils Relative %: 54.4 %
Platelets: 241 10*3/uL (ref 140–400)
RBC: 4.55 10*6/uL (ref 3.80–5.10)
RDW: 12.7 % (ref 11.0–15.0)
Total Lymphocyte: 34.8 %
WBC: 7.1 10*3/uL (ref 3.8–10.8)

## 2023-03-30 LAB — LIPID PANEL
Cholesterol: 248 mg/dL — ABNORMAL HIGH (ref <200)
HDL: 42 mg/dL — ABNORMAL LOW (ref >=50)
LDL Cholesterol (Calc): 165 mg/dL — ABNORMAL HIGH
Non-HDL Cholesterol (Calc): 206 mg/dL — ABNORMAL HIGH (ref <130)
Total CHOL/HDL Ratio: 5.9 (calc) — ABNORMAL HIGH (ref <5.0)
Triglycerides: 242 mg/dL — ABNORMAL HIGH (ref <150)

## 2023-03-30 LAB — CYTOLOGY - PAP
Adequacy: ABSENT
Comment: NEGATIVE
Diagnosis: NEGATIVE
High risk HPV: NEGATIVE

## 2023-04-06 ENCOUNTER — Telehealth: Payer: Self-pay

## 2023-04-06 NOTE — Telephone Encounter (Signed)
Pt given lab results per notes of Dr. Caralee Ates on 04/02/23. Pt verbalized understanding. Patient stated she has been really bad with diet lately and does no want to start on Statin medication until she gives diet and exercise a good try. Patient states she is not willing to take the Statin drug at this time. Patient also asked when should she schedule a follow-up appointment and have her A1C checked because it was not checked at last visit. Advised I would forward message to PCP for advice. Patient verbalized understanding.  Margarita Mail, DO 04/02/2023  8:48 AM EST     LDL or bad type of cholesterol worse, however triglycerides which are sugary fats improved. This is because sugars/diabetes is better controlled but I still recommend considering a statin medication to lower LDL and risk of heart attack and stroke. I can send this now or we can discuss more at follow up. No abnormalities noted with kidneys, liver or electrolytes. No anemia. Pap negative.

## 2023-04-16 NOTE — Telephone Encounter (Signed)
I have scheduled her an appt for July 17, 2023 at 8:20

## 2023-04-19 ENCOUNTER — Ambulatory Visit: Payer: BC Managed Care – PPO | Admitting: Nurse Practitioner

## 2023-04-28 ENCOUNTER — Other Ambulatory Visit: Payer: Self-pay | Admitting: Internal Medicine

## 2023-04-28 DIAGNOSIS — E1165 Type 2 diabetes mellitus with hyperglycemia: Secondary | ICD-10-CM

## 2023-04-30 NOTE — Telephone Encounter (Signed)
Requested by interface surescripts. Future visit in 2 months.  Requested Prescriptions  Pending Prescriptions Disp Refills   metFORMIN (GLUCOPHAGE-XR) 500 MG 24 hr tablet [Pharmacy Med Name: METFORMIN HCL ER 500 MG TABLET] 180 tablet 1    Sig: TAKE 1 TABLET BY MOUTH TWICE A DAY WITH FOOD     Endocrinology:  Diabetes - Biguanides Failed - 04/30/2023  9:15 AM      Failed - B12 Level in normal range and within 720 days    No results found for: "VITAMINB12"       Passed - Cr in normal range and within 360 days    Creat  Date Value Ref Range Status  03/29/2023 0.76 0.50 - 1.05 mg/dL Final   Creatinine, Urine  Date Value Ref Range Status  07/04/2022 48 20 - 275 mg/dL Final         Passed - HBA1C is between 0 and 7.9 and within 180 days    Hemoglobin A1C  Date Value Ref Range Status  01/08/2023 6.5 (A) 4.0 - 5.6 % Final   Hgb A1c MFr Bld  Date Value Ref Range Status  10/04/2022 6.5 (H) <5.7 % of total Hgb Final    Comment:    For someone without known diabetes, a hemoglobin A1c value of 6.5% or greater indicates that they may have  diabetes and this should be confirmed with a follow-up  test. . For someone with known diabetes, a value <7% indicates  that their diabetes is well controlled and a value  greater than or equal to 7% indicates suboptimal  control. A1c targets should be individualized based on  duration of diabetes, age, comorbid conditions, and  other considerations. . Currently, no consensus exists regarding use of hemoglobin A1c for diagnosis of diabetes for children. .          Passed - eGFR in normal range and within 360 days    GFR calc Af Amer  Date Value Ref Range Status  12/25/2019 89 >59 mL/min/1.73 Final    Comment:    **[facility] currently reports eGFR in compliance with the current**   recommendations of the [employer]. [facility] will   update reporting as new guidelines are published from the NKF-ASN   Task force.    GFR calc non  Af Amer  Date Value Ref Range Status  12/25/2019 77 >59 mL/min/1.73 Final   eGFR  Date Value Ref Range Status  03/29/2023 89 > OR = 60 mL/min/1.30m2 Final         Passed - Valid encounter within last 6 months    Recent Outpatient Visits           1 month ago Annual physical exam   [facility] Margarita Mail, DO   3 months ago Type 2 diabetes mellitus with hyperglycemia, without long-term current use of insulin [facility])   [facility] Margarita Mail, DO   4 months ago Bell's palsy   [facility] Margarita Mail, DO   6 months ago Type 2 diabetes mellitus with hyperglycemia, without long-term current use of insulin [facility])   [facility] Margarita Mail, DO   10 months ago Type 2 diabetes mellitus with hyperglycemia, without long-term current use of insulin [facility])   [facility] Margarita Mail, DO       Future Appointments             In 2 months Caralee Ates,  Gentry Fitz, DO [facility], [facility]            Passed - CBC within normal limits and completed in the last 12 months    WBC  Date Value Ref Range Status  03/29/2023 7.1 3.8 - 10.8 Thousand/uL Final   RBC  Date Value Ref Range Status  03/29/2023 4.55 3.80 - 5.10 Million/uL Final   Hemoglobin  Date Value Ref Range Status  03/29/2023 13.7 11.7 - 15.5 g/dL Final   HCT  Date Value Ref Range Status  03/29/2023 41.8 35.0 - 45.0 % Final   MCHC  Date Value Ref Range Status  03/29/2023 32.8 32.0 - 36.0 g/dL Final    Comment:    For adults, a slight decrease in the calculated MCHC value (in the range of 30 to 32 g/dL) is most likely not clinically significant; however, it should be interpreted with caution in correlation with other red cell parameters and the patient's clinical condition.    [facility]  Date Value Ref Range Status  03/29/2023 30.1 27.0 -  33.0 pg Final   MCV  Date Value Ref Range Status  03/29/2023 91.9 80.0 - 100.0 fL Final   No results found for: "PLTCOUNTKUC", "LABPLAT", "POCPLA" RDW  Date Value Ref Range Status  03/29/2023 12.7 11.0 - 15.0 % Final

## 2023-05-24 ENCOUNTER — Other Ambulatory Visit: Payer: Self-pay | Admitting: Internal Medicine

## 2023-05-24 DIAGNOSIS — Z1231 Encounter for screening mammogram for malignant neoplasm of breast: Secondary | ICD-10-CM

## 2023-06-12 ENCOUNTER — Ambulatory Visit
Admission: RE | Admit: 2023-06-12 | Discharge: 2023-06-12 | Disposition: A | Discharge status: Home / Self Care | Payer: BC Managed Care – PPO | Source: Ambulatory Visit | Attending: Internal Medicine | Admitting: Internal Medicine

## 2023-06-12 DIAGNOSIS — Z1231 Encounter for screening mammogram for malignant neoplasm of breast: Secondary | ICD-10-CM | POA: Diagnosis not present

## 2023-06-13 ENCOUNTER — Telehealth: Payer: BC Managed Care – PPO | Admitting: Physician Assistant

## 2023-06-13 DIAGNOSIS — K047 Periapical abscess without sinus: Secondary | ICD-10-CM | POA: Diagnosis not present

## 2023-06-13 MED ORDER — AMOXICILLIN-POT CLAVULANATE 875-125 MG PO TABS: 1.0000 | ORAL_TABLET | Freq: Two times a day (BID) | ORAL | 0 refills | Status: DC

## 2023-06-13 NOTE — Progress Notes (Signed)
I have spent 5 minutes in review of e-visit questionnaire, review and updating patient chart, medical decision making and response to patient.   Piedad Climes, PA-C

## 2023-06-13 NOTE — Addendum Note (Signed)
Addended by: Waldon Merl on: 06/13/2023 02:57 PM   Modules accepted: Orders

## 2023-06-13 NOTE — Progress Notes (Signed)

## 2023-06-14 ENCOUNTER — Encounter: Payer: Self-pay | Admitting: Internal Medicine

## 2023-07-17 ENCOUNTER — Other Ambulatory Visit: Payer: Self-pay

## 2023-07-17 ENCOUNTER — Ambulatory Visit: Payer: BC Managed Care – PPO | Admitting: Internal Medicine

## 2023-07-17 VITALS — BP 128/76 | HR 98 | Temp 97.6°F | Resp 16 | Ht 67.0 in | Wt 288.8 lb

## 2023-07-17 DIAGNOSIS — E1165 Type 2 diabetes mellitus with hyperglycemia: Secondary | ICD-10-CM | POA: Diagnosis not present

## 2023-07-17 LAB — POCT GLYCOSYLATED HEMOGLOBIN (HGB A1C): Hemoglobin A1C: 7.1 % — AB (ref 4.0–5.6)

## 2023-07-17 NOTE — Progress Notes (Signed)
 Established Patient Office Visit  Subjective    Patient ID: Bethany Nash, female    DOB: 03-17-1961  Age: 63 y.o. MRN: [id number]  CC:  Chief Complaint  Patient presents with   Medical Management of Chronic Issues    HPI Bethany Nash presents to follow up on diabetes. Since our LOV the patient had to have 2 teeth pulled and was on antibiotics but is doing well now.   Diabetes, Type 2: -Last A1c 6.5% 5/24 -Medications: Metformin 500 mg BID XR -Diet: a little worse lately  -Exercise: None currently -Eye exam: UTD 1/25 -Foot exam: Due -Microalbumin: Due -PNA vaccine: UTD -Denies symptoms of hypoglycemia, polyuria, polydipsia, numbness extremities, foot ulcers/trauma.   HLD: -Medications: Nothing  -Last lipid panel: Lipid Panel     Component Value Date/Time   CHOL 248 (H) 03/29/2023 0958   CHOL 232 (H) 12/25/2019 1011   TRIG 242 (H) 03/29/2023 0958   HDL 42 (L) 03/29/2023 0958   HDL 36 (L) 12/25/2019 1011   CHOLHDL 5.9 (H) 03/29/2023 0958   LDLCALC 165 (H) 03/29/2023 0958   LABVLDL 43 (H) 12/25/2019 1011   The 10-year ASCVD risk score (Arnett DK, et al., 2019) is: 11.2%   Values used to calculate the score:     Age: 81 years     Sex: Female     Is Non-Hispanic African American: No     Diabetic: Yes     Tobacco smoker: No     Systolic Blood Pressure: 128 mmHg     Is BP treated: No     HDL Cholesterol: 42 mg/dL     Total Cholesterol: 248 mg/dL  Health Maintenance: -Blood work UTD -Mammogram 1/25 Birads-1  Outpatient Encounter Medications as of 07/17/2023  Medication Sig   acidophilus (RISAQUAD) CAPS capsule Take by mouth daily.   Biotin 1 MG CAPS Take by mouth.   metFORMIN (GLUCOPHAGE-XR) 500 MG 24 hr tablet TAKE 1 TABLET BY MOUTH TWICE A DAY WITH FOOD   Multiple Vitamins-Minerals (BODY/HAIR/SKIN/NAILS PO) Take 1 tablet by mouth daily.   amoxicillin-clavulanate (AUGMENTIN) 875-125 MG tablet Take 1 tablet by mouth 2 (two) times daily. (Patient not taking: Reported  on 07/17/2023)   No facility-administered encounter medications on file as of 07/17/2023.    Past Medical History:  Diagnosis Date   Obesity    Type 2 diabetes mellitus with hyperglycemia, without long-term current use of insulin (HCC) 04/03/2022    Past Surgical History:  Procedure Laterality Date   ABDOMINAL HYSTERECTOMY  1992   still has ovaries   ANKLE ARTHROSCOPY WITH FUSION  2007   CESAREAN SECTION     CHOLECYSTECTOMY  2008   COLONOSCOPY WITH PROPOFOL N/A 01/27/2019   Procedure: COLONOSCOPY WITH PROPOFOL;  Surgeon: Toney Reil, MD;  Location: [facility] ENDOSCOPY;  Service: Gastroenterology;  Laterality: N/A;   FRACTURE SURGERY  1967, 1970, 2006   TUBAL LIGATION  1989    Family History  Problem Relation Age of Onset   Cancer Mother 82       unsure where it started   Heart disease Father    Alcoholism Father    Stomach cancer Brother    Cancer Brother    Breast cancer Neg Hx    Ovarian cancer Neg Hx    Colon cancer Neg Hx     Social History   Socioeconomic History   Marital status: Divorced    Spouse name: Not on file   Number of children: Not on file  Years of education: Not on file   Highest education level: Associate degree: occupational, Scientist, product/process development, or vocational program  Occupational History   Not on file  Tobacco Use   Smoking status: Never   Smokeless tobacco: Never   Tobacco comments:    Never smoked  Vaping Use   Vaping status: Never Used  Substance and Sexual Activity   Alcohol use: Yes    Alcohol/week: 1.0 standard drink of alcohol    Types: 1 Standard drinks or equivalent per week    Comment: Less than 1 per week on average   Drug use: Never   Sexual activity: Not Currently    Birth control/protection: Surgical  Other Topics Concern   Not on file  Social History Narrative   Not on file   Social Drivers of Health   Financial Resource Strain: Low Risk  (07/17/2023)   Overall Financial Resource Strain (CARDIA)    Difficulty of Paying  Living Expenses: Not very hard  Food Insecurity: No Food Insecurity (07/17/2023)   Hunger Vital Sign    Worried About Running Out of Food in the Last Year: Never true    Ran Out of Food in the Last Year: Never true  Transportation Needs: No Transportation Needs (07/17/2023)   PRAPARE - Administrator, Civil Service (Medical): No    Lack of Transportation (Non-Medical): No  Physical Activity: Insufficiently Active (07/17/2023)   Exercise Vital Sign    Days of Exercise per Week: 2 days    Minutes of Exercise per Session: 30 min  Stress: No Stress Concern Present (07/17/2023)   [employer] of Occupational Health - Occupational Stress Questionnaire    Feeling of Stress : Only a little  Social Connections: Moderately Isolated (07/17/2023)   Social Connection and Isolation Panel [NHANES]    Frequency of Communication with Friends and Family: Twice a week    Frequency of Social Gatherings with Friends and Family: More than three times a week    Attends Religious Services: 1 to 4 times per year    Active Member of [employer] or Organizations: No    Attends Engineer, structural: Not on file    Marital Status: Divorced  Intimate Partner Violence: Not on file    Review of Systems  Constitutional:  Negative for chills and fever.  Eyes:  Negative for blurred vision.  Respiratory:  Negative for shortness of breath.   Cardiovascular:  Negative for chest pain.  Gastrointestinal:  Negative for abdominal pain, diarrhea, nausea and vomiting.      Objective    BP 128/76 (Cuff Size: Large)   Pulse 98   Temp 97.6 F (36.4 C) (Oral)   Resp 16   Ht 5\' 7"  (1.702 m)   Wt 288 lb 12.8 oz (131 kg)   SpO2 94%   BMI 45.23 kg/m   Physical Exam Constitutional:      Appearance: Normal appearance.  HENT:     Head: Normocephalic and atraumatic.  Eyes:     Conjunctiva/sclera: Conjunctivae normal.  Cardiovascular:     Rate and Rhythm: Normal rate and regular rhythm.     Pulses:           Dorsalis pedis pulses are 2+ on the right side and 2+ on the left side.  Pulmonary:     Effort: Pulmonary effort is normal.     Breath sounds: Normal breath sounds.  Musculoskeletal:     Right foot: Normal range of motion. No deformity, bunion, Charcot foot,  foot drop or prominent metatarsal heads.     Left foot: Normal range of motion. No deformity, bunion, Charcot foot, foot drop or prominent metatarsal heads.  Feet:     Right foot:     Protective Sensation: 6 sites tested.  6 sites sensed.     Skin integrity: Skin integrity normal.     Toenail Condition: Right toenails are normal.     Left foot:     Protective Sensation: 6 sites tested.  6 sites sensed.     Skin integrity: Skin integrity normal.     Toenail Condition: Left toenails are normal.  Skin:    General: Skin is warm and dry.  Neurological:     General: No focal deficit present.     Mental Status: She is alert. Mental status is at baseline.  Psychiatric:        Mood and Affect: Mood normal.        Behavior: Behavior normal.         Assessment & Plan:   1. Type 2 diabetes mellitus with hyperglycemia, without long-term current use of insulin (HCC) (Primary): A1c increased to 7.1%, patient states she has not been doing well on her diet lately. Does not want to increased medication, continue Metformin 500 mg BID XR, A1c still controlled. Foot exam and microalbumin today. Follow up in 6 months.   - POCT HgB A1C - HM Diabetes Foot Exam - Urine Microalbumin w/creat. ratio    Return in about 6 months (around 01/17/2024).   Margarita Mail, DO

## 2023-07-18 LAB — MICROALBUMIN / CREATININE URINE RATIO
Creatinine, Urine: 153 mg/dL (ref 20–275)
Microalb Creat Ratio: 3 mg/g{creat} (ref <30)
Microalb, Ur: 0.5 mg/dL

## 2023-08-13 DIAGNOSIS — L578 Other skin changes due to chronic exposure to nonionizing radiation: Secondary | ICD-10-CM | POA: Diagnosis not present

## 2023-08-13 DIAGNOSIS — L821 Other seborrheic keratosis: Secondary | ICD-10-CM | POA: Diagnosis not present

## 2023-08-13 DIAGNOSIS — Z86018 Personal history of other benign neoplasm: Secondary | ICD-10-CM | POA: Diagnosis not present

## 2023-11-09 ENCOUNTER — Other Ambulatory Visit: Payer: Self-pay | Admitting: Internal Medicine

## 2023-11-09 DIAGNOSIS — E1165 Type 2 diabetes mellitus with hyperglycemia: Secondary | ICD-10-CM

## 2023-11-09 NOTE — Telephone Encounter (Signed)
 Requested Prescriptions  Pending Prescriptions Disp Refills   metFORMIN  (GLUCOPHAGE -XR) 500 MG 24 hr tablet [Pharmacy Med Name: METFORMIN  HCL ER 500 MG TABLET] 180 tablet 1    Sig: TAKE 1 TABLET BY MOUTH TWICE A DAY WITH FOOD     Endocrinology:  Diabetes - Biguanides Failed - 11/09/2023  3:51 PM      Failed - B12 Level in normal range and within 720 days    No results found for: VITAMINB12       Passed - Cr in normal range and within 360 days    Creat  Date Value Ref Range Status  03/29/2023 0.76 0.50 - 1.05 mg/dL Final   Creatinine, Urine  Date Value Ref Range Status  07/17/2023 153 20 - 275 mg/dL Final         Passed - HBA1C is between 0 and 7.9 and within 180 days    Hemoglobin A1C  Date Value Ref Range Status  07/17/2023 7.1 (A) 4.0 - 5.6 % Final   Hgb A1c MFr Bld  Date Value Ref Range Status  10/04/2022 6.5 (H) <5.7 % of total Hgb Final    Comment:    For someone without known diabetes, a hemoglobin A1c value of 6.5% or greater indicates that they may have  diabetes and this should be confirmed with a follow-up  test. . For someone with known diabetes, a value <7% indicates  that their diabetes is well controlled and a value  greater than or equal to 7% indicates suboptimal  control. A1c targets should be individualized based on  duration of diabetes, age, comorbid conditions, and  other considerations. . Currently, no consensus exists regarding use of hemoglobin A1c for diagnosis of diabetes for children. .          Passed - eGFR in normal range and within 360 days    GFR calc Af Amer  Date Value Ref Range Status  12/25/2019 89 >59 mL/min/1.73 Final    Comment:    **[facility] currently reports eGFR in compliance with the current**   recommendations of the [employer]. [facility] will   update reporting as new guidelines are published from the NKF-ASN   Task force.    GFR calc non Af Amer  Date Value Ref Range Status  12/25/2019 77 >59  mL/min/1.73 Final   eGFR  Date Value Ref Range Status  03/29/2023 89 > OR = 60 mL/min/1.35m2 Final         Passed - Valid encounter within last 6 months    Recent Outpatient Visits           3 months ago Type 2 diabetes mellitus with hyperglycemia, without long-term current use of insulin [facility])   [facility] Bernardo Fend, DO       Future Appointments             In 2 months Bernardo Fend, DO [facility], [facility]            Passed - CBC within normal limits and completed in the last 12 months    WBC  Date Value Ref Range Status  03/29/2023 7.1 3.8 - 10.8 Thousand/uL Final   RBC  Date Value Ref Range Status  03/29/2023 4.55 3.80 - 5.10 Million/uL Final   Hemoglobin  Date Value Ref Range Status  03/29/2023 13.7 11.7 - 15.5 g/dL Final   HCT  Date Value Ref Range Status  03/29/2023 41.8 35.0 - 45.0 % Final  MCHC  Date Value Ref Range Status  03/29/2023 32.8 32.0 - 36.0 g/dL Final    Comment:    For adults, a slight decrease in the calculated MCHC value (in the range of 30 to 32 g/dL) is most likely not clinically significant; however, it should be interpreted with caution in correlation with other red cell parameters and the patient's clinical condition.    [facility]  Date Value Ref Range Status  03/29/2023 30.1 27.0 - 33.0 pg Final   MCV  Date Value Ref Range Status  03/29/2023 91.9 80.0 - 100.0 fL Final   No results found for: PLTCOUNTKUC, LABPLAT, POCPLA RDW  Date Value Ref Range Status  03/29/2023 12.7 11.0 - 15.0 % Final

## 2024-01-17 ENCOUNTER — Encounter: Payer: Self-pay | Admitting: Internal Medicine

## 2024-01-17 ENCOUNTER — Ambulatory Visit: Admitting: Internal Medicine

## 2024-01-17 ENCOUNTER — Other Ambulatory Visit: Payer: Self-pay

## 2024-01-17 VITALS — BP 124/82 | HR 66 | Temp 98.1°F | Resp 16 | Ht 67.0 in | Wt 292.7 lb

## 2024-01-17 DIAGNOSIS — W57XXXA Bitten or stung by nonvenomous insect and other nonvenomous arthropods, initial encounter: Secondary | ICD-10-CM

## 2024-01-17 DIAGNOSIS — Z7984 Long term (current) use of oral hypoglycemic drugs: Secondary | ICD-10-CM

## 2024-01-17 DIAGNOSIS — E1165 Type 2 diabetes mellitus with hyperglycemia: Secondary | ICD-10-CM

## 2024-01-17 LAB — POCT GLYCOSYLATED HEMOGLOBIN (HGB A1C): Hemoglobin A1C: 7.1 % — AB (ref 4.0–5.6)

## 2024-01-17 NOTE — Progress Notes (Signed)
 Established Patient Office Visit  Subjective    Patient ID: Bethany Nash, female    DOB: 1960-09-16  Age: 63 y.o. MRN: [id number]  CC:  Chief Complaint  Patient presents with   Medical Management of Chronic Issues    6 month follow up    HPI Bethany Nash presents to follow up on diabetes.   Discussed the use of AI scribe software for clinical note transcription with the patient, who gave verbal consent to proceed.  History of Present Illness Bethany Nash is a 63 year old female with type 2 diabetes who presents for an A1c check.  Her type 2 diabetes was diagnosed in February 2024 with an initial A1c of 8.1. Dietary changes reduced it to 6.5, but it has stabilized at 7.1 since March 2025. She takes metformin  extended release 500 mg twice daily. Significant stress from working one and a half jobs and caring for her grandchildren is present. She snacks on sweets when stressed, potentially affecting her blood sugar levels.  She experiences reactions to insect bites, causing redness and itching, managed with topical Benadryl and occasional oral antihistamines. She is planning a trip to [place] in December. She is up to date with pneumonia and RSV vaccinations and plans to receive her COVID booster and flu shot soon. She has annual diabetic eye exams, with the last visit in early 2024.  Diabetes, Type 2: -Last A1c 7.1% 3/25 -Medications: Metformin  500 mg BID XR -Diet: a little worse lately - eating more candy than normal -Exercise: None currently -Eye exam: UTD 1/25 -Foot exam: UTD -Microalbumin: UTD 3/25 -PNA vaccine: UTD -Denies symptoms of hypoglycemia, polyuria, polydipsia, numbness extremities, foot ulcers/trauma.   HLD: -Medications: Nothing  -Last lipid panel: Lipid Panel     Component Value Date/Time   CHOL 248 (H) 03/29/2023 0958   CHOL 232 (H) 12/25/2019 1011   TRIG 242 (H) 03/29/2023 0958   HDL 42 (L) 03/29/2023 0958   HDL 36 (L) 12/25/2019 1011   CHOLHDL 5.9 (H)  03/29/2023 0958   LDLCALC 165 (H) 03/29/2023 0958   LABVLDL 43 (H) 12/25/2019 1011   The 10-year ASCVD risk score (Arnett DK, et al., 2019) is: 10.5%   Values used to calculate the score:     Age: 52 years     Clincally relevant sex: Female     Is Non-Hispanic African American: No     Diabetic: Yes     Tobacco smoker: No     Systolic Blood Pressure: 124 mmHg     Is BP treated: No     HDL Cholesterol: 42 mg/dL     Total Cholesterol: 248 mg/dL  Health Maintenance: -Blood work UTD -Mammogram 1/25 Birads-1  Outpatient Encounter Medications as of 01/17/2024  Medication Sig   acidophilus (RISAQUAD) CAPS capsule Take by mouth daily.   Biotin 1 MG CAPS Take by mouth.   metFORMIN  (GLUCOPHAGE -XR) 500 MG 24 hr tablet TAKE 1 TABLET BY MOUTH TWICE A DAY WITH FOOD   Multiple Vitamins-Minerals (BODY/HAIR/SKIN/NAILS PO) Take 1 tablet by mouth daily.   No facility-administered encounter medications on file as of 01/17/2024.    Past Medical History:  Diagnosis Date   Obesity    Type 2 diabetes mellitus with hyperglycemia, without long-term current use of insulin (HCC) 04/03/2022    Past Surgical History:  Procedure Laterality Date   ABDOMINAL HYSTERECTOMY  1992   still has ovaries   ANKLE ARTHROSCOPY WITH FUSION  2007   CESAREAN SECTION  CHOLECYSTECTOMY  2008   COLONOSCOPY WITH PROPOFOL  N/A 01/27/2019   Procedure: COLONOSCOPY WITH PROPOFOL ;  Surgeon: Unk Corinn Skiff, MD;  Location: [facility] ENDOSCOPY;  Service: Gastroenterology;  Laterality: N/A;   FRACTURE SURGERY  1967, 1970, 2006   TUBAL LIGATION  1989    Family History  Problem Relation Age of Onset   Cancer Mother 36       unsure where it started   Heart disease Father    Alcoholism Father    Stomach cancer Brother    Cancer Brother    Breast cancer Neg Hx    Ovarian cancer Neg Hx    Colon cancer Neg Hx     Social History   Socioeconomic History   Marital status: Divorced    Spouse name: Not on file   Number of  children: Not on file   Years of education: Not on file   Highest education level: Associate degree: academic program  Occupational History   Not on file  Tobacco Use   Smoking status: Never   Smokeless tobacco: Never   Tobacco comments:    Never smoked  Vaping Use   Vaping status: Never Used  Substance and Sexual Activity   Alcohol use: Yes    Alcohol/week: 1.0 standard drink of alcohol    Types: 1 Standard drinks or equivalent per week    Comment: Less than 1 per week on average   Drug use: Never   Sexual activity: Not Currently    Birth control/protection: Surgical  Other Topics Concern   Not on file  Social History Narrative   Not on file   Social Drivers of Health   Financial Resource Strain: Low Risk  (01/15/2024)   Overall Financial Resource Strain (CARDIA)    Difficulty of Paying Living Expenses: Not hard at all  Food Insecurity: No Food Insecurity (01/15/2024)   Hunger Vital Sign    Worried About Running Out of Food in the Last Year: Never true    Ran Out of Food in the Last Year: Never true  Transportation Needs: No Transportation Needs (01/15/2024)   PRAPARE - Administrator, Civil Service (Medical): No    Lack of Transportation (Non-Medical): No  Physical Activity: Insufficiently Active (01/15/2024)   Exercise Vital Sign    Days of Exercise per Week: 4 days    Minutes of Exercise per Session: 30 min  Stress: Stress Concern Present (01/15/2024)   [employer] of Occupational Health - Occupational Stress Questionnaire    Feeling of Stress: To some extent  Social Connections: Moderately Isolated (01/15/2024)   Social Connection and Isolation Panel    Frequency of Communication with Friends and Family: More than three times a week    Frequency of Social Gatherings with Friends and Family: More than three times a week    Attends Religious Services: 1 to 4 times per year    Active Member of [employer] or Organizations: No    Attends Hospital doctor: Not on file    Marital Status: Divorced  Intimate Partner Violence: Not on file    Review of Systems  All other systems reviewed and are negative.     Objective    BP 124/82   Pulse 66   Temp 98.1 F (36.7 C) (Oral)   Resp 16   Ht 5' 7 (1.702 m)   Wt 292 lb 11.2 oz (132.8 kg)   SpO2 98%   BMI 45.84 kg/m   Physical Exam Constitutional:  Appearance: Normal appearance.  HENT:     Head: Normocephalic and atraumatic.  Eyes:     Conjunctiva/sclera: Conjunctivae normal.  Cardiovascular:     Rate and Rhythm: Normal rate and regular rhythm.  Pulmonary:     Effort: Pulmonary effort is normal.     Breath sounds: Normal breath sounds.  Skin:    General: Skin is warm and dry.     Comments: Multiple large bug bites on arms  Neurological:     General: No focal deficit present.     Mental Status: She is alert. Mental status is at baseline.  Psychiatric:        Mood and Affect: Mood normal.        Behavior: Behavior normal.         Assessment & Plan:   Assessment & Plan Type 2 diabetes mellitus without complications A1c improved to 7.1% from 8.1% with dietary changes. - Continue metformin  extended release 500 mg twice daily. - Encourage exercise and dietary management. - Schedule follow-up in three months for repeat A1c and comprehensive labs including cholesterol and kidney function.  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 - Use over-the-counter topical Benadryl for itching. - Take oral antihistamines as needed for severe reactions.  General Health Maintenance Discussed immunizations and routine screenings. COVID-19 and flu vaccines recommended. Eye exam due in December or January. - Obtain COVID-19 and flu vaccines at pharmacy. - Schedule eye exam in December or January.   - POCT HgB A1C  Return in about 3 months (around 04/17/2024) for follow up on a1c.    Sharyle Fischer, DO

## 2024-02-08 ENCOUNTER — Other Ambulatory Visit: Payer: Self-pay | Admitting: Internal Medicine

## 2024-02-08 DIAGNOSIS — E1165 Type 2 diabetes mellitus with hyperglycemia: Secondary | ICD-10-CM

## 2024-02-08 NOTE — Telephone Encounter (Signed)
 Requested Prescriptions  Pending Prescriptions Disp Refills   metFORMIN  (GLUCOPHAGE -XR) 500 MG 24 hr tablet [Pharmacy Med Name: METFORMIN  HCL ER 500 MG TABLET] 180 tablet 1    Sig: TAKE 1 TABLET BY MOUTH TWICE A DAY WITH FOOD     Endocrinology:  Diabetes - Biguanides Failed - 02/08/2024  4:08 PM      Failed - B12 Level in normal range and within 720 days    No results found for: VITAMINB12       Passed - Cr in normal range and within 360 days    Creat  Date Value Ref Range Status  03/29/2023 0.76 0.50 - 1.05 mg/dL Final   Creatinine, Urine  Date Value Ref Range Status  07/17/2023 153 20 - 275 mg/dL Final         Passed - HBA1C is between 0 and 7.9 and within 180 days    Hemoglobin A1C  Date Value Ref Range Status  01/17/2024 7.1 (A) 4.0 - 5.6 % Final   Hgb A1c MFr Bld  Date Value Ref Range Status  10/04/2022 6.5 (H) <5.7 % of total Hgb Final    Comment:    For someone without known diabetes, a hemoglobin A1c value of 6.5% or greater indicates that they may have  diabetes and this should be confirmed with a follow-up  test. . For someone with known diabetes, a value <7% indicates  that their diabetes is well controlled and a value  greater than or equal to 7% indicates suboptimal  control. A1c targets should be individualized based on  duration of diabetes, age, comorbid conditions, and  other considerations. . Currently, no consensus exists regarding use of hemoglobin A1c for diagnosis of diabetes for children. .          Passed - eGFR in normal range and within 360 days    GFR calc Af Amer  Date Value Ref Range Status  12/25/2019 89 >59 mL/min/1.73 Final    Comment:    **[facility] currently reports eGFR in compliance with the current**   recommendations of the [employer]. [facility] will   update reporting as new guidelines are published from the NKF-ASN   Task force.    GFR calc non Af Amer  Date Value Ref Range Status  12/25/2019 77 >59  mL/min/1.73 Final   eGFR  Date Value Ref Range Status  03/29/2023 89 > OR = 60 mL/min/1.91m2 Final         Passed - Valid encounter within last 6 months    Recent Outpatient Visits           3 weeks ago Type 2 diabetes mellitus with hyperglycemia, without long-term current use of insulin [facility])   [facility] Bernardo Fend, DO   6 months ago Type 2 diabetes mellitus with hyperglycemia, without long-term current use of insulin [facility])   [facility] Bernardo Fend, DO       Future Appointments             In 2 months Bernardo Fend, DO  [facility] [place]            Passed - CBC within normal limits and completed in the last 12 months    WBC  Date Value Ref Range Status  03/29/2023 7.1 3.8 - 10.8 Thousand/uL Final   RBC  Date Value Ref Range Status  03/29/2023 4.55 3.80 - 5.10 Million/uL Final   Hemoglobin  Date Value  Ref Range Status  03/29/2023 13.7 11.7 - 15.5 g/dL Final   HCT  Date Value Ref Range Status  03/29/2023 41.8 35.0 - 45.0 % Final   MCHC  Date Value Ref Range Status  03/29/2023 32.8 32.0 - 36.0 g/dL Final    Comment:    For adults, a slight decrease in the calculated MCHC value (in the range of 30 to 32 g/dL) is most likely not clinically significant; however, it should be interpreted with caution in correlation with other red cell parameters and the patient's clinical condition.    [facility]  Date Value Ref Range Status  03/29/2023 30.1 27.0 - 33.0 pg Final   MCV  Date Value Ref Range Status  03/29/2023 91.9 80.0 - 100.0 fL Final   No results found for: PLTCOUNTKUC, LABPLAT, POCPLA RDW  Date Value Ref Range Status  03/29/2023 12.7 11.0 - 15.0 % Final

## 2024-04-18 ENCOUNTER — Encounter: Payer: Self-pay | Admitting: Internal Medicine

## 2024-04-18 ENCOUNTER — Ambulatory Visit: Admitting: Internal Medicine

## 2024-04-18 VITALS — BP 132/76 | HR 94 | Temp 97.8°F | Resp 16 | Ht 67.0 in | Wt 296.2 lb

## 2024-04-18 DIAGNOSIS — E782 Mixed hyperlipidemia: Secondary | ICD-10-CM | POA: Diagnosis not present

## 2024-04-18 DIAGNOSIS — Z1231 Encounter for screening mammogram for malignant neoplasm of breast: Secondary | ICD-10-CM

## 2024-04-18 DIAGNOSIS — Z7984 Long term (current) use of oral hypoglycemic drugs: Secondary | ICD-10-CM

## 2024-04-18 DIAGNOSIS — E7800 Pure hypercholesterolemia, unspecified: Secondary | ICD-10-CM | POA: Diagnosis not present

## 2024-04-18 DIAGNOSIS — E1165 Type 2 diabetes mellitus with hyperglycemia: Secondary | ICD-10-CM | POA: Diagnosis not present

## 2024-04-18 NOTE — Progress Notes (Signed)
 Established Patient Office Visit  Subjective    Patient ID: Bethany Nash, female    DOB: Jul 05, 1960  Age: 63 y.o. MRN: [id number]  CC:  Chief Complaint  Patient presents with   Medical Management of Chronic Issues    HPI Bethany Nash presents to follow up on diabetes.   Discussed the use of AI scribe software for clinical note transcription with the patient, who gave verbal consent to proceed.  History of Present Illness  Bethany Nash is a 63 year old female who presents for her annual physical exam and routine lab work.  She reports being up to date on flu, pneumonia, RSV, tetanus, shingles, and COVID vaccines, with flu and COVID most recent. She has a mild productive cough she attributes to postnasal drip, without sore throat, and uses nasal saline, nasal sprays, and a humidifier as needed. She is planning a two-week trip to [place] to visit her children.  Diabetes, Type 2: -Last A1c 7.1% 9/25 -Medications: Metformin  500 mg BID XR -Diet: a little worse lately - eating more candy than normal -Exercise: None currently -Eye exam: UTD 1/25 -Foot exam: UTD -Microalbumin: UTD 3/25 -PNA vaccine: UTD -Denies symptoms of hypoglycemia, polyuria, polydipsia, numbness extremities, foot ulcers/trauma.   HLD: -Medications: Nothing  -Last lipid panel: Lipid Panel     Component Value Date/Time   CHOL 248 (H) 03/29/2023 0958   CHOL 232 (H) 12/25/2019 1011   TRIG 242 (H) 03/29/2023 0958   HDL 42 (L) 03/29/2023 0958   HDL 36 (L) 12/25/2019 1011   CHOLHDL 5.9 (H) 03/29/2023 0958   LDLCALC 165 (H) 03/29/2023 0958   LABVLDL 43 (H) 12/25/2019 1011   The 10-year ASCVD risk score (Arnett DK, et al., 2019) is: 11.8%   Values used to calculate the score:     Age: 33 years     Clincally relevant sex: Female     Is Non-Hispanic African American: No     Diabetic: Yes     Tobacco smoker: No     Systolic Blood Pressure: 132 mmHg     Is BP treated: No     HDL Cholesterol: 42 mg/dL     Total  Cholesterol: 248 mg/dL  Health Maintenance: -Blood work due -Mammogram 1/25 Birads-1  Outpatient Encounter Medications as of 04/18/2024  Medication Sig   acidophilus (RISAQUAD) CAPS capsule Take by mouth daily.   Biotin 1 MG CAPS Take by mouth.   metFORMIN  (GLUCOPHAGE -XR) 500 MG 24 hr tablet TAKE 1 TABLET BY MOUTH TWICE A DAY WITH FOOD   Multiple Vitamins-Minerals (BODY/HAIR/SKIN/NAILS PO) Take 1 tablet by mouth daily.   No facility-administered encounter medications on file as of 04/18/2024.    Past Medical History:  Diagnosis Date   Obesity    Type 2 diabetes mellitus with hyperglycemia, without long-term current use of insulin (HCC) 04/03/2022    Past Surgical History:  Procedure Laterality Date   ABDOMINAL HYSTERECTOMY  1992   still has ovaries   ANKLE ARTHROSCOPY WITH FUSION  2007   CESAREAN SECTION     CHOLECYSTECTOMY  2008   COLONOSCOPY WITH PROPOFOL  N/A 01/27/2019   Procedure: COLONOSCOPY WITH PROPOFOL ;  Surgeon: Unk Corinn Skiff, MD;  Location: [facility] ENDOSCOPY;  Service: Gastroenterology;  Laterality: N/A;   FRACTURE SURGERY  1967, 1970, 2006   TUBAL LIGATION  1989    Family History  Problem Relation Age of Onset   Cancer Mother 10       unsure where it started   Heart disease  Father    Alcoholism Father    Stomach cancer Brother    Cancer Brother    Breast cancer Neg Hx    Ovarian cancer Neg Hx    Colon cancer Neg Hx     Social History   Socioeconomic History   Marital status: Divorced    Spouse name: Not on file   Number of children: Not on file   Years of education: Not on file   Highest education level: Associate degree: academic program  Occupational History   Not on file  Tobacco Use   Smoking status: Never   Smokeless tobacco: Never   Tobacco comments:    Never smoked  Vaping Use   Vaping status: Never Used  Substance and Sexual Activity   Alcohol use: Yes    Alcohol/week: 1.0 standard drink of alcohol    Types: 1 Standard drinks or  equivalent per week    Comment: Less than 1 per week on average   Drug use: Never   Sexual activity: Not Currently    Birth control/protection: Surgical  Other Topics Concern   Not on file  Social History Narrative   Not on file   Social Drivers of Health   Financial Resource Strain: Low Risk  (04/15/2024)   Overall Financial Resource Strain (CARDIA)    Difficulty of Paying Living Expenses: Not hard at all  Food Insecurity: No Food Insecurity (04/15/2024)   Hunger Vital Sign    Worried About Running Out of Food in the Last Year: Never true    Ran Out of Food in the Last Year: Never true  Transportation Needs: No Transportation Needs (04/15/2024)   PRAPARE - Administrator, Civil Service (Medical): No    Lack of Transportation (Non-Medical): No  Physical Activity: Insufficiently Active (04/15/2024)   Exercise Vital Sign    Days of Exercise per Week: 3 days    Minutes of Exercise per Session: 30 min  Stress: No Stress Concern Present (04/15/2024)   [employer] of Occupational Health - Occupational Stress Questionnaire    Feeling of Stress: Not at all  Social Connections: Moderately Isolated (04/15/2024)   Social Connection and Isolation Panel    Frequency of Communication with Friends and Family: More than three times a week    Frequency of Social Gatherings with Friends and Family: More than three times a week    Attends Religious Services: 1 to 4 times per year    Active Member of [employer] or Organizations: No    Attends Engineer, Structural: Not on file    Marital Status: Divorced  Intimate Partner Violence: Not on file    Review of Systems  All other systems reviewed and are negative.     Objective    BP 132/76 (Cuff Size: Large)   Pulse 94   Temp 97.8 F (36.6 C) (Oral)   Resp 16   Ht 5' 7 (1.702 m)   Wt 296 lb 3.2 oz (134.4 kg)   SpO2 95%   BMI 46.39 kg/m   Physical Exam Constitutional:      Appearance: Normal appearance.  HENT:      Head: Normocephalic and atraumatic.  Eyes:     Conjunctiva/sclera: Conjunctivae normal.  Cardiovascular:     Rate and Rhythm: Normal rate and regular rhythm.  Pulmonary:     Effort: Pulmonary effort is normal.     Breath sounds: Normal breath sounds.  Skin:    General: Skin is warm and dry.  Comments: Multiple large bug bites on arms  Neurological:     General: No focal deficit present.     Mental Status: She is alert. Mental status is at baseline.  Psychiatric:        Mood and Affect: Mood normal.        Behavior: Behavior normal.         Assessment & Plan:   Assessment & Plan  Type 2 diabetes mellitus Managed with metformin  500 mf XR BID. No acute issues reported. - Ordered A1c test as well as yearly labs.  - Continue metformin  as prescribed.  Hypercholesterolemia No acute issues reported. - Ordered cholesterol test.  General Health Maintenance Mammogram is due soon. - Ordered mammogram. - Conducted routine labs including kidney, liver, and electrolytes tests.  - CBC w/Diff/Platelet - Comprehensive Metabolic Panel (CMET) - HgB A1c - Lipid Profile - MM 3D SCREENING MAMMOGRAM BILATERAL BREAST; Future   Return in about 6 months (around 10/17/2024).   Sharyle Fischer, DO

## 2024-04-19 LAB — CBC WITH DIFFERENTIAL/PLATELET
Absolute Lymphocytes: 2718 {cells}/uL (ref 850–3900)
Absolute Monocytes: 561 {cells}/uL (ref 200–950)
Basophils Absolute: 63 {cells}/uL (ref 0–200)
Basophils Relative: 0.8 %
Eosinophils Absolute: 174 {cells}/uL (ref 15–500)
Eosinophils Relative: 2.2 %
HCT: 40.1 % (ref 35.9–46.0)
Hemoglobin: 13.2 g/dL (ref 11.7–15.5)
MCH: 29.5 pg (ref 27.0–33.0)
MCHC: 32.9 g/dL (ref 31.6–35.4)
MCV: 89.7 fL (ref 81.4–101.7)
MPV: 10.8 fL (ref 7.5–12.5)
Monocytes Relative: 7.1 %
Neutro Abs: 4385 {cells}/uL (ref 1500–7800)
Neutrophils Relative %: 55.5 %
Platelets: 245 Thousand/uL (ref 140–400)
RBC: 4.47 Million/uL (ref 3.80–5.10)
RDW: 13.1 % (ref 11.0–15.0)
Total Lymphocyte: 34.4 %
WBC: 7.9 Thousand/uL (ref 3.8–10.8)

## 2024-04-19 LAB — COMPREHENSIVE METABOLIC PANEL WITH GFR
AG Ratio: 1.5 (calc) (ref 1.0–2.5)
ALT: 13 U/L (ref 6–29)
AST: 15 U/L (ref 10–35)
Albumin: 4 g/dL (ref 3.6–5.1)
Alkaline phosphatase (APISO): 65 U/L (ref 37–153)
BUN: 13 mg/dL (ref 7–25)
CO2: 27 mmol/L (ref 20–32)
Calcium: 9.3 mg/dL (ref 8.6–10.4)
Chloride: 103 mmol/L (ref 98–110)
Creat: 0.73 mg/dL (ref 0.50–1.05)
Globulin: 2.7 g/dL (ref 1.9–3.7)
Glucose, Bld: 120 mg/dL — ABNORMAL HIGH (ref 65–99)
Potassium: 4.2 mmol/L (ref 3.5–5.3)
Sodium: 138 mmol/L (ref 135–146)
Total Bilirubin: 0.5 mg/dL (ref 0.2–1.2)
Total Protein: 6.7 g/dL (ref 6.1–8.1)
eGFR: 92 mL/min/1.73m2 (ref >=60)

## 2024-04-19 LAB — LIPID PANEL
Cholesterol: 237 mg/dL — ABNORMAL HIGH (ref <200)
HDL: 39 mg/dL — ABNORMAL LOW (ref >=50)
LDL Cholesterol (Calc): 159 mg/dL — ABNORMAL HIGH
Non-HDL Cholesterol (Calc): 198 mg/dL — ABNORMAL HIGH (ref <130)
Total CHOL/HDL Ratio: 6.1 (calc) — ABNORMAL HIGH (ref <5.0)
Triglycerides: 228 mg/dL — ABNORMAL HIGH (ref <150)

## 2024-04-19 LAB — HEMOGLOBIN A1C
Hgb A1c MFr Bld: 7.9 % — ABNORMAL HIGH (ref <5.7)
Mean Plasma Glucose: 180 mg/dL
eAG (mmol/L): 10 mmol/L

## 2024-04-21 ENCOUNTER — Ambulatory Visit: Payer: Self-pay | Admitting: Internal Medicine

## 2024-06-11 ENCOUNTER — Other Ambulatory Visit: Payer: Self-pay | Admitting: Medical Genetics

## 2024-06-18 ENCOUNTER — Ambulatory Visit
Admission: RE | Admit: 2024-06-18 | Discharge: 2024-06-18 | Disposition: A | Source: Ambulatory Visit | Attending: Internal Medicine

## 2024-06-18 DIAGNOSIS — Z1231 Encounter for screening mammogram for malignant neoplasm of breast: Secondary | ICD-10-CM

## 2024-06-24 ENCOUNTER — Other Ambulatory Visit

## 2024-10-21 ENCOUNTER — Ambulatory Visit: Admitting: Internal Medicine
